# Patient Record
Sex: Female | Born: 1985 | Hispanic: Yes | Marital: Married | Smoking: Never smoker
Health system: Southern US, Community
[De-identification: ages and names within clinical notes are randomized; demographics above are authoritative.]

## PROBLEM LIST (undated history)

## (undated) DIAGNOSIS — E785 Hyperlipidemia, unspecified: Secondary | ICD-10-CM

## (undated) DIAGNOSIS — T4145XA Adverse effect of unspecified anesthetic, initial encounter: Secondary | ICD-10-CM

## (undated) DIAGNOSIS — E1165 Type 2 diabetes mellitus with hyperglycemia: Secondary | ICD-10-CM

## (undated) DIAGNOSIS — T8859XA Other complications of anesthesia, initial encounter: Secondary | ICD-10-CM

## (undated) HISTORY — DX: Adverse effect of unspecified anesthetic, initial encounter: T41.45XA

## (undated) HISTORY — DX: Other complications of anesthesia, initial encounter: T88.59XA

## (undated) HISTORY — DX: Hyperlipidemia, unspecified: E78.5

## (undated) HISTORY — DX: Type 2 diabetes mellitus with hyperglycemia: E11.65

---
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
## 2006-06-30 IMAGING — US US OB COMP +14 WK
2 series · 12 of 28 positions shown · non-contrast
Comparison: none

CLINICAL DATA: 39.4 weeks assigned gestational age with back pain and sporadic contractions  No prenatal care.

[Series 1: us ob comp +14 wk · 0.28mm/px · 9 of 54 slices shown (1 of 2)]
[im 3/54]
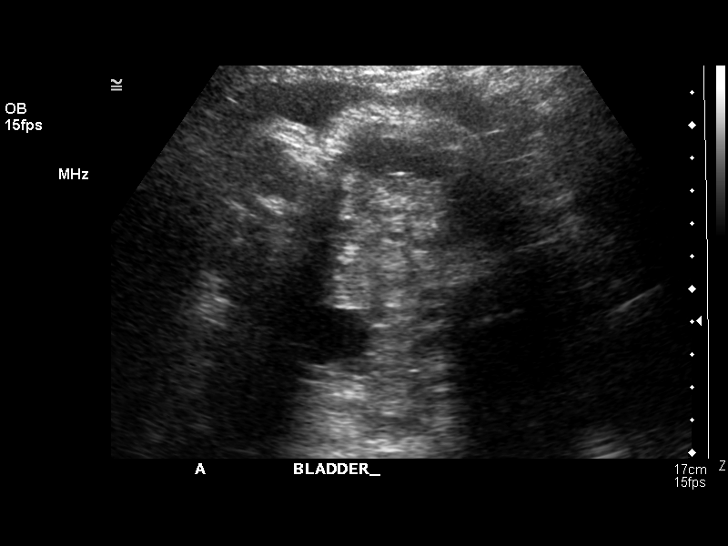
[im 9/54]
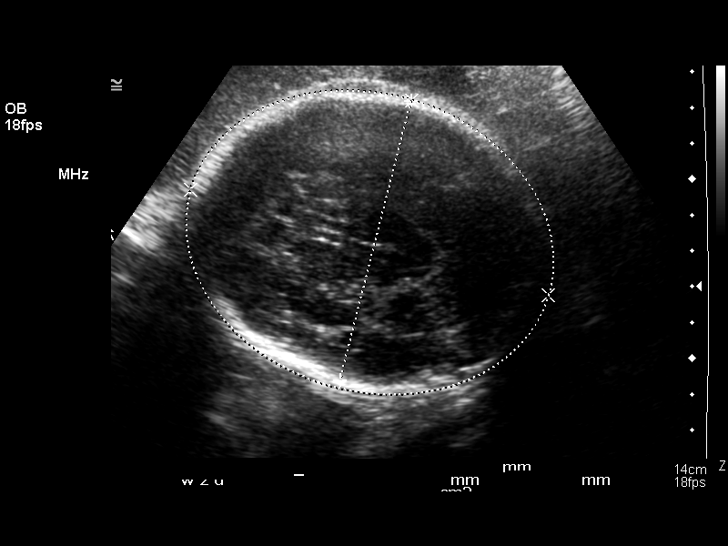
[im 14/54]
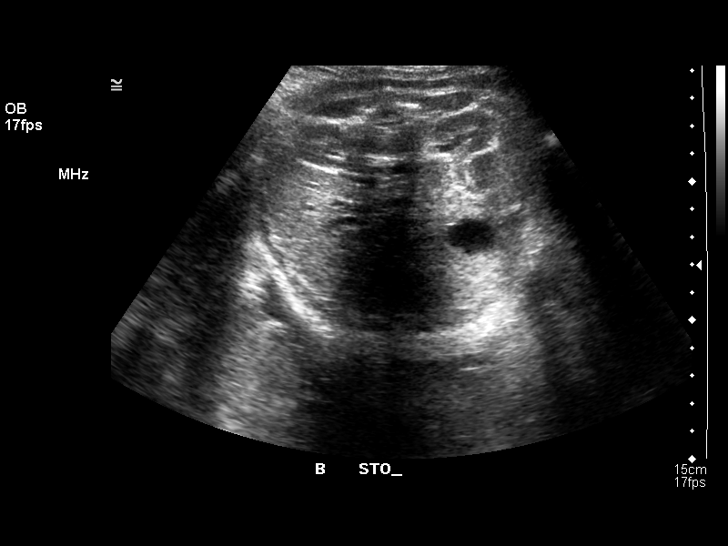
[im 23/54]
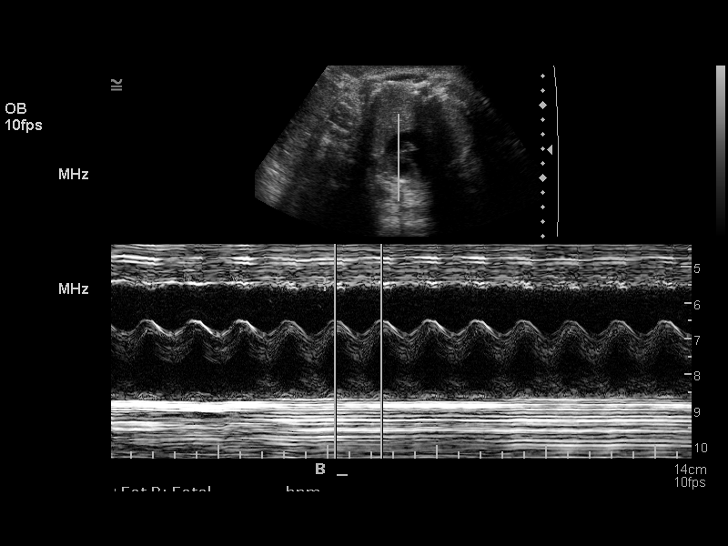
[im 28/54]
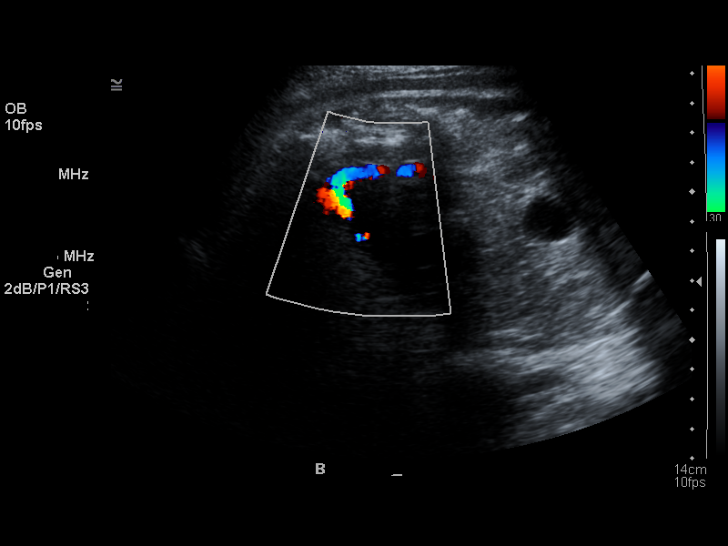
[im 34/54]
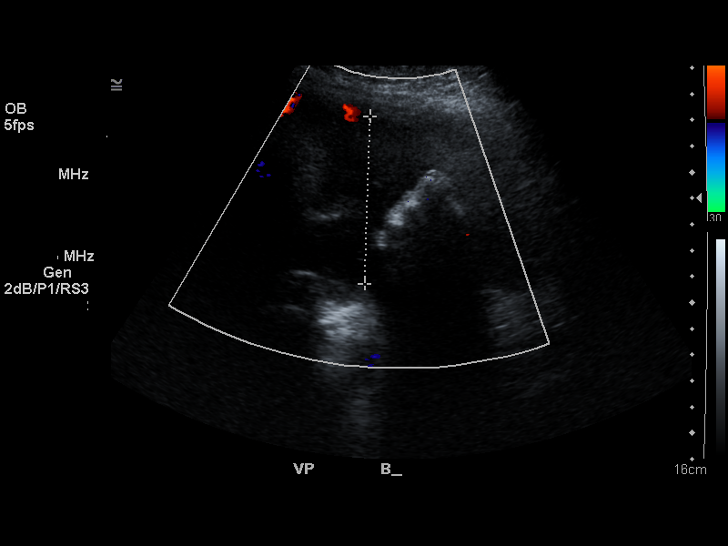
[im 42/54]
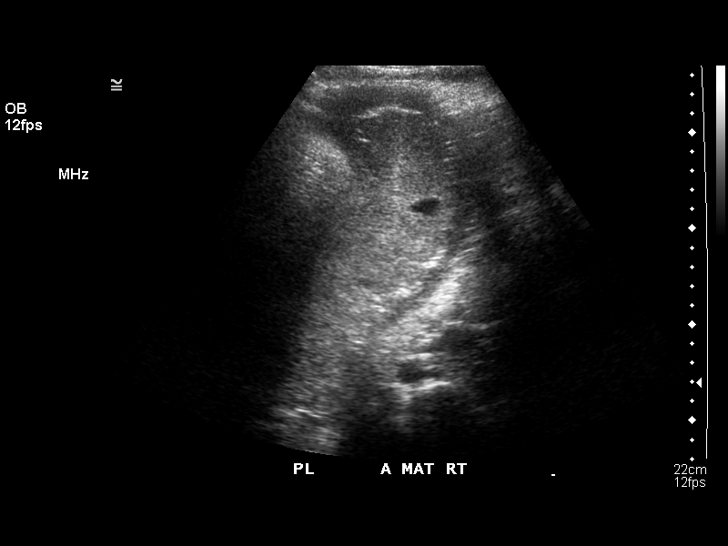
[im 48/54]
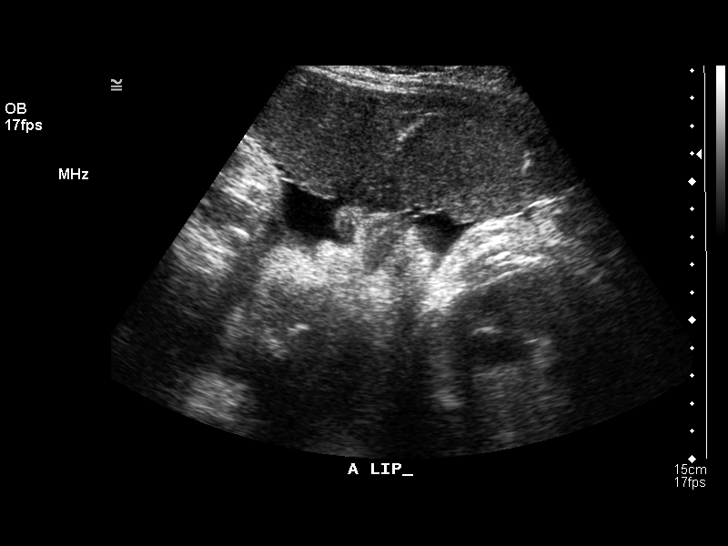
[im 54/54]
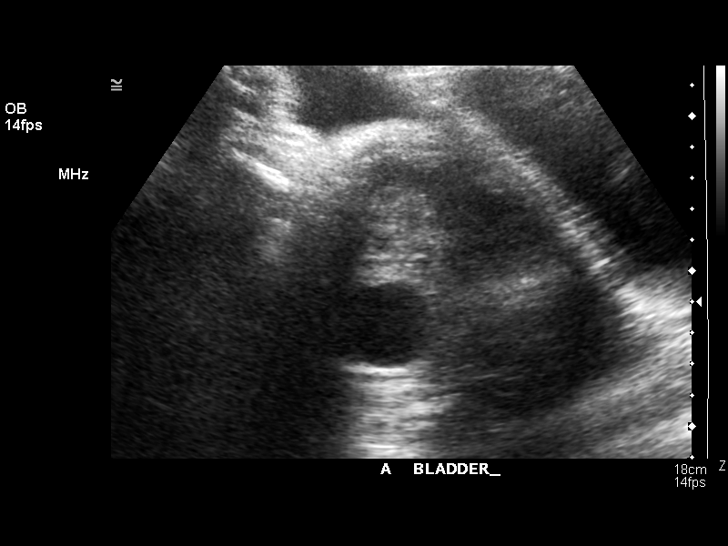

[Series 1: us ob comp +14 wk · 0.35mm/px · 3 of 23 slices shown (2 of 2)]
[im 7/23]
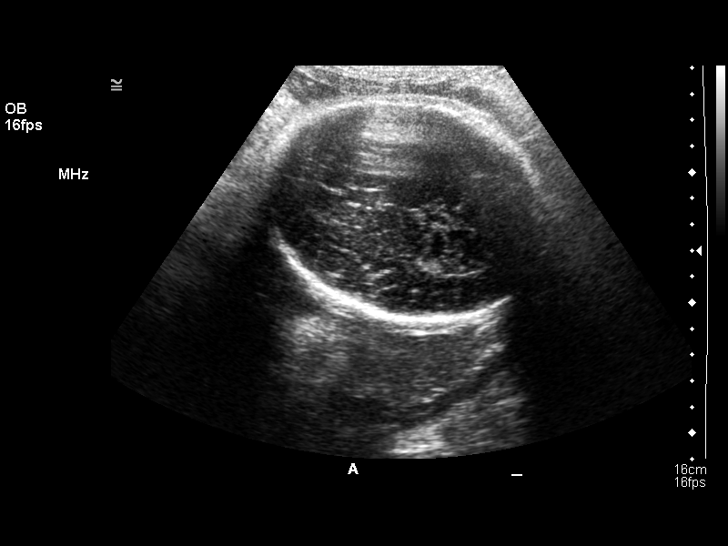
[im 13/23]
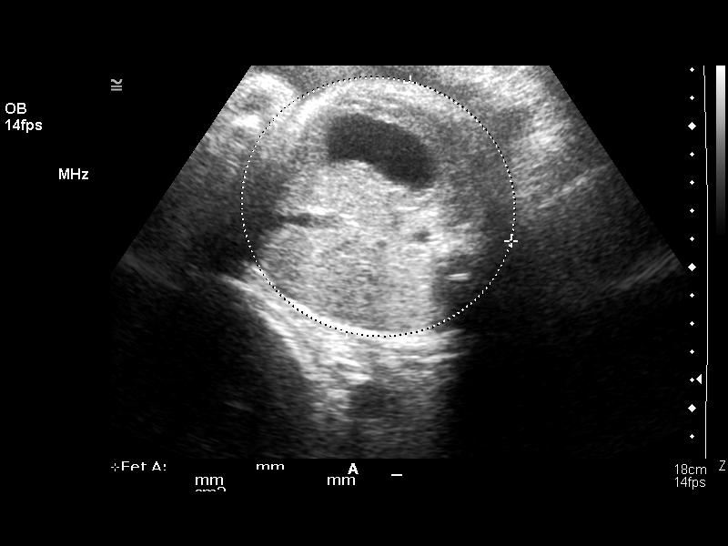
[im 19/23]
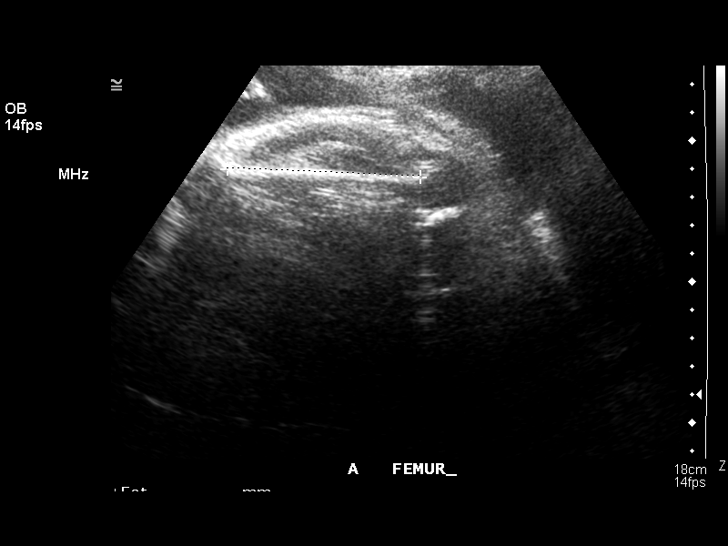

[12 of 28 positions shown; findings below may reference images not displayed]

TWIN OBSTETRICAL ULTRASOUND:
A living twin gestation is present.  A separating membrane is seen.

TWIN A:
Heart Rate:  163
Movement:  Yes
Breathing:  Yes
Presentation:  Breech
Placental Location:  Fundal, left lateral 
Grade:  II
Previa:  No
Amniotic Fluid (Subjective): Low normal
Amniotic Fluid (Objective):   2.8 cm Vertical pocket 

FETAL BIOMETRY (TWIN A)
BPD:  8.2 cm   33 w 4 d
HC:  30.5 cm  34 w 6 d
AC:  29.4 cm  33 w 3 d
FL:  7.1 cm  36 w 3 d

Mean GA:  34 w 2 d  US EDC:  11/04/05

EFW: 2422 g (H) Less than 5th%ile (5th = 1041 g) For 40 wks 

FETAL ANATOMY (TWIN A)
Lateral Ventricles:  Visualized 
Thalami/CSP:  Visualized 
Posterior Fossa:  Not visualized 
Nuchal Region:  N/A
Spine:  Not visualized 
4 Chamber Heart on L:  Visualized 
Stomach on Left:  Visualized 
3 Vessel Cord:  Visualized 
Cord Insertion Site:  Not visualized 
Kidneys:  Visualized 
Bladder:  Visualized  
Extremities:  Not visualized 

Additional Anatomy Visualized: Upper lip and orbits.
Evaluation limited by:  Fetal position and advanced gestational age.

TWIN B:
Heart Rate:  141
Movement:  Yes
Breathing:  Yes
Presentation:  Transverse with head to maternal right
Placental Location:  Anterior
Grade:  III
Previa:  No
Amniotic Fluid (Subjective):  Normal
Amniotic Fluid (Objective):   6.4 cm Vertical pocket 

FETAL BIOMETRY (TWIN B)
BPD:  9.0 cm   36 w 3 d
HC:  32.8 cm   37 w 2 d
AC:  31.7 cm   35 w 4 d
FL:  7.2 cm   36 w 6 d

Mean GA:  36 w 4 d  US EDC:  10/19/05

EFW:  3882 g (H) 5th ? 10th%ile (1041 ? 6619 g) For 40 wks

FETAL ANATOMY (TWIN B)
Lateral Ventricles:  Not visualized 
Thalami/CSP:  Not visualized 
Posterior Fossa:  Not visualized 
Nuchal Region:  N/A
Spine:  Not visualized  
4 Chamber Heart on L:  Visualized 
Stomach on Left:  Visualized 
3 Vessel Cord:  Visualized 
Cord Insertion Site:  Not visualized 
Kidneys:  Visualized 
Bladder:  Visualized 
Extremities:  Not visualized 

Evaluation limited By:  Fetal position and advanced gestational age.

MATERNAL UTERINE AND ADNEXAL FINDINGS
Cervix:  Not evaluated.  

BIOPHYSICAL PROFILE OF TWIN GESTATION:

TWIN A:
Movement:   2    Time:  30 minutes
Breathing:  2
Tone:  2
Amniotic Fluid:  2

Total Score:  8

TWIN B:
Movements: 2    Time:  30 minutes
Breathing:  2
Tone:  2
Amniotic Fluid:  2

Total Score:  8
IMPRESSION: 1.  Twin living intrauterine gestation.  A separating membrane is identified.  Fetus A is in breech presentation with subjectively low normal-to-decreased amniotic fluid volume.  The deepest measurable vertical pocket for twin A is 2.8 cm.  Estimated mean gestational age for fetus A is 34 weeks 2 days which is more than 5 weeks behind the reported assigned gestational age.  There is fairly good concordance of the fetal biometric parameters with the best estimated fetal weight falling below the 5th percentile for a 40 week gestation.  Imaging features are suspicious for IUGR.  
2.  Assessment of fetal A anatomy is limited by fetal position and the advanced gestational age, but no gross fetal anatomic anomaly is discernible.  
3.  Biophysical profile for twin A is [DATE] after 30 minutes of observation.  
4.  Twin B is in transverse presentation with subjectively normal amniotic fluid volume.  Estimated mean gestational age for twin B is 36 weeks 4 days which is approximately 3 weeks behind the reported assigned gestational age.  
5.  Best estimated fetal weight would be in the 5th to 10th percentile for a 40 week gestation and a small-for-gestational-age fetus is a concern.
6.  Visualized fetal anatomy for twin B is unremarkable although the assessment is limited by the advanced gestational age and fetal position.  
7.  Biophysical profile score for twin B is [DATE] after 30 minutes of observation.
8.  A preliminary copy of this report was returned with the patient to the [facility] at the time that the study was completed.

## 2006-11-13 ENCOUNTER — Ambulatory Visit: Payer: Self-pay | Admitting: Internal Medicine

## 2006-11-21 ENCOUNTER — Ambulatory Visit: Payer: Self-pay | Admitting: *Deleted

## 2007-01-06 ENCOUNTER — Ambulatory Visit: Payer: Self-pay | Admitting: Internal Medicine

## 2007-01-06 ENCOUNTER — Encounter: Payer: Self-pay | Admitting: Internal Medicine

## 2007-11-19 ENCOUNTER — Ambulatory Visit: Payer: Self-pay | Admitting: Nurse Practitioner

## 2007-11-20 ENCOUNTER — Encounter (INDEPENDENT_AMBULATORY_CARE_PROVIDER_SITE_OTHER): Payer: Self-pay | Admitting: Nurse Practitioner

## 2008-01-20 ENCOUNTER — Ambulatory Visit: Payer: Self-pay | Admitting: Nurse Practitioner

## 2008-01-20 LAB — CONVERTED CEMR LAB
AST: 75 units/L — ABNORMAL HIGH (ref 0–37)
Albumin: 4.1 g/dL (ref 3.5–5.2)
BUN: 9 mg/dL (ref 6–23)
Basophils Absolute: 0 10*3/uL (ref 0.0–0.1)
Basophils Relative: 1 % (ref 0–1)
CO2: 22 meq/L (ref 19–32)
Calcium: 9.2 mg/dL (ref 8.4–10.5)
Chloride: 105 meq/L (ref 96–112)
GC Probe Amp, Genital: NEGATIVE
HDL: 53 mg/dL (ref >39)
MCHC: 32.7 g/dL (ref 30.0–36.0)
MCV: 88.4 fL (ref 78.0–100.0)
Neutro Abs: 4.4 10*3/uL (ref 1.7–7.7)
Nitrite: NEGATIVE
Pap Smear: NEGATIVE
Platelets: 292 10*3/uL (ref 150–400)
Potassium: 4.4 meq/L (ref 3.5–5.3)
Protein, U semiquant: 30
RDW: 13.3 % (ref 11.5–15.5)
Specific Gravity, Urine: 1.02
Total Protein: 7.3 g/dL (ref 6.0–8.3)
Triglycerides: 244 mg/dL — ABNORMAL HIGH (ref <150)
Urobilinogen, UA: 0.2

## 2008-01-21 ENCOUNTER — Encounter (INDEPENDENT_AMBULATORY_CARE_PROVIDER_SITE_OTHER): Payer: Self-pay | Admitting: Nurse Practitioner

## 2008-01-27 ENCOUNTER — Encounter (INDEPENDENT_AMBULATORY_CARE_PROVIDER_SITE_OTHER): Payer: Self-pay | Admitting: Nurse Practitioner

## 2008-01-27 LAB — CONVERTED CEMR LAB
Hep A Total Ab: POSITIVE — AB
Hep B Core Total Ab: NEGATIVE
Hep B E Ab: NEGATIVE
Hep B S Ab: POSITIVE — AB

## 2008-03-23 ENCOUNTER — Ambulatory Visit: Payer: Self-pay | Admitting: Nurse Practitioner

## 2008-03-25 LAB — CONVERTED CEMR LAB
Cholesterol: 208 mg/dL — ABNORMAL HIGH (ref 0–200)
LDL Cholesterol: 90 mg/dL (ref 0–99)
Total CHOL/HDL Ratio: 4

## 2008-07-06 ENCOUNTER — Ambulatory Visit: Payer: Self-pay | Admitting: Nurse Practitioner

## 2008-07-06 DIAGNOSIS — E7800 Pure hypercholesterolemia, unspecified: Secondary | ICD-10-CM

## 2008-08-17 ENCOUNTER — Ambulatory Visit: Payer: Self-pay | Admitting: Nurse Practitioner

## 2008-08-17 LAB — CONVERTED CEMR LAB
ALT: 26 units/L (ref 0–35)
Albumin: 4.1 g/dL (ref 3.5–5.2)
HDL: 42 mg/dL (ref >39)
Indirect Bilirubin: 0.3 mg/dL (ref 0.0–0.9)
Total CHOL/HDL Ratio: 3.8
Total Protein: 7.3 g/dL (ref 6.0–8.3)
VLDL: 44 mg/dL — ABNORMAL HIGH (ref 0–40)

## 2008-08-18 ENCOUNTER — Encounter (INDEPENDENT_AMBULATORY_CARE_PROVIDER_SITE_OTHER): Payer: Self-pay | Admitting: Nurse Practitioner

## 2009-03-29 ENCOUNTER — Encounter (INDEPENDENT_AMBULATORY_CARE_PROVIDER_SITE_OTHER): Payer: Self-pay | Admitting: Nurse Practitioner

## 2009-03-29 ENCOUNTER — Ambulatory Visit: Payer: Self-pay | Admitting: Nurse Practitioner

## 2009-03-29 LAB — CONVERTED CEMR LAB
Glucose, Urine, Semiquant: NEGATIVE
KOH Prep: NEGATIVE
Ketones, urine, test strip: NEGATIVE
Nitrite: NEGATIVE
Protein, U semiquant: NEGATIVE
Urobilinogen, UA: 0.2

## 2009-03-30 ENCOUNTER — Encounter (INDEPENDENT_AMBULATORY_CARE_PROVIDER_SITE_OTHER): Payer: Self-pay | Admitting: Nurse Practitioner

## 2009-03-30 LAB — CONVERTED CEMR LAB
ALT: 20 units/L (ref 0–35)
AST: 17 units/L (ref 0–37)
Albumin: 4.3 g/dL (ref 3.5–5.2)
Alkaline Phosphatase: 83 units/L (ref 39–117)
BUN: 13 mg/dL (ref 6–23)
Basophils Absolute: 0 10*3/uL (ref 0.0–0.1)
Basophils Relative: 0 % (ref 0–1)
CO2: 21 meq/L (ref 19–32)
Calcium: 9.2 mg/dL (ref 8.4–10.5)
Chlamydia, DNA Probe: NEGATIVE
Chloride: 106 meq/L (ref 96–112)
Cholesterol: 156 mg/dL (ref 0–200)
Creatinine, Ser: 0.74 mg/dL (ref 0.40–1.20)
Eosinophils Absolute: 0.2 10*3/uL (ref 0.0–0.7)
Eosinophils Relative: 2 % (ref 0–5)
GC Probe Amp, Genital: NEGATIVE
Glucose, Bld: 93 mg/dL (ref 70–99)
HCT: 43.9 % (ref 36.0–46.0)
HDL: 37 mg/dL — ABNORMAL LOW (ref >39)
Hemoglobin: 14.6 g/dL (ref 12.0–15.0)
Lymphocytes Relative: 35 % (ref 12–46)
Lymphs Abs: 2.6 10*3/uL (ref 0.7–4.0)
MCHC: 33.3 g/dL (ref 30.0–36.0)
MCV: 85.6 fL (ref 78.0–100.0)
Monocytes Absolute: 0.6 10*3/uL (ref 0.1–1.0)
Monocytes Relative: 8 % (ref 3–12)
Neutro Abs: 4.1 10*3/uL (ref 1.7–7.7)
Neutrophils Relative %: 55 % (ref 43–77)
Platelets: 291 10*3/uL (ref 150–400)
Potassium: 4.2 meq/L (ref 3.5–5.3)
RBC: 5.13 M/uL — ABNORMAL HIGH (ref 3.87–5.11)
RDW: 13.5 % (ref 11.5–15.5)
Sodium: 140 meq/L (ref 135–145)
TSH: 1.042 microintl units/mL (ref 0.350–4.500)
Total Bilirubin: 0.4 mg/dL (ref 0.3–1.2)
Total CHOL/HDL Ratio: 4.2
Total Protein: 7.5 g/dL (ref 6.0–8.3)
Triglycerides: 407 mg/dL — ABNORMAL HIGH (ref <150)
WBC: 7.5 10*3/uL (ref 4.0–10.5)

## 2010-08-14 ENCOUNTER — Encounter (INDEPENDENT_AMBULATORY_CARE_PROVIDER_SITE_OTHER): Payer: Self-pay | Admitting: Nurse Practitioner

## 2010-08-14 ENCOUNTER — Ambulatory Visit: Payer: Self-pay | Admitting: Nurse Practitioner

## 2010-08-14 DIAGNOSIS — E669 Obesity, unspecified: Secondary | ICD-10-CM | POA: Insufficient documentation

## 2010-08-14 LAB — CONVERTED CEMR LAB
Bilirubin Urine: NEGATIVE
Ketones, urine, test strip: NEGATIVE
Nitrite: NEGATIVE
Protein, U semiquant: NEGATIVE
Urobilinogen, UA: 0.2
pH: 6

## 2010-08-15 ENCOUNTER — Encounter (INDEPENDENT_AMBULATORY_CARE_PROVIDER_SITE_OTHER): Payer: Self-pay | Admitting: Nurse Practitioner

## 2010-08-16 ENCOUNTER — Encounter (INDEPENDENT_AMBULATORY_CARE_PROVIDER_SITE_OTHER): Payer: Self-pay | Admitting: Nurse Practitioner

## 2010-08-16 DIAGNOSIS — O24919 Unspecified diabetes mellitus in pregnancy, unspecified trimester: Secondary | ICD-10-CM

## 2010-08-16 LAB — CONVERTED CEMR LAB
Alkaline Phosphatase: 69 units/L (ref 39–117)
BUN: 13 mg/dL (ref 6–23)
CO2: 25 meq/L (ref 19–32)
Chloride: 103 meq/L (ref 96–112)
Eosinophils Absolute: 0.2 10*3/uL (ref 0.0–0.7)
Glucose, Bld: 145 mg/dL — ABNORMAL HIGH (ref 70–99)
HCT: 44 % (ref 36.0–46.0)
Lymphs Abs: 3.3 10*3/uL (ref 0.7–4.0)
MCHC: 33.2 g/dL (ref 30.0–36.0)
MCV: 87.5 fL (ref 78.0–100.0)
RBC: 5.03 M/uL (ref 3.87–5.11)
RDW: 12.8 % (ref 11.5–15.5)
Sodium: 138 meq/L (ref 135–145)
TSH: 1.314 microintl units/mL (ref 0.350–4.500)
Total Protein: 7.3 g/dL (ref 6.0–8.3)
WBC: 9.4 10*3/uL (ref 4.0–10.5)

## 2010-08-17 ENCOUNTER — Telehealth (INDEPENDENT_AMBULATORY_CARE_PROVIDER_SITE_OTHER): Payer: Self-pay | Admitting: Nurse Practitioner

## 2010-08-17 ENCOUNTER — Ambulatory Visit: Payer: Self-pay | Admitting: Nurse Practitioner

## 2010-08-17 ENCOUNTER — Encounter (INDEPENDENT_AMBULATORY_CARE_PROVIDER_SITE_OTHER): Payer: Self-pay | Admitting: Nurse Practitioner

## 2010-08-17 LAB — CONVERTED CEMR LAB
Cholesterol: 197 mg/dL (ref 0–200)
HDL: 50 mg/dL (ref >39)
LDL Cholesterol: 115 mg/dL — ABNORMAL HIGH (ref 0–99)
Triglycerides: 161 mg/dL — ABNORMAL HIGH (ref <150)
VLDL: 32 mg/dL (ref 0–40)

## 2010-08-18 ENCOUNTER — Encounter (INDEPENDENT_AMBULATORY_CARE_PROVIDER_SITE_OTHER): Payer: Self-pay | Admitting: Nurse Practitioner

## 2010-09-19 ENCOUNTER — Ambulatory Visit: Admit: 2010-09-19 | Payer: Self-pay | Admitting: Nurse Practitioner

## 2010-09-28 NOTE — Progress Notes (Signed)
Summary: Office Visit//DEPRESSION SCREENING  Office Visit//DEPRESSION SCREENING   Imported By: Arta Bruce 08/15/2010 09:28:05  _____________________________________________________________________  External Attachment:    Type:   Image     Comment:   External Document

## 2010-09-28 NOTE — Progress Notes (Signed)
Summary: PAP results  Phone Note Outgoing Call   Summary of Call: Notify pt that PAP is normal. no cancer cells seen however she does have a yeast infection will treat with fluconazole 150mg  by mouth x 1 dose instruct pt to take antibiotics (rx given today in office) for uti THEN take the one diflucan for yeast Rx in basket - fax to pt's pharamcy; will be $4 at [facility] Initial call taken by: Lehman Prom FNP,  August 17, 2010 2:27 PM  Follow-up for Phone Call        Called [phone].... Husband answered and gave me pt's phone #... Called [phone], lm for pt. to call me .Marland Kitchen... Hale Drone CMA  August 18, 2010 10:50 AM  Pt. returned my call... notifed her of the above info.... Will be coming to p/u Rx's today before we break for the holiday.... Hale Drone CMA  August 18, 2010 11:32 AM      New/Updated Medications: FLUCONAZOLE 150 MG TABS (FLUCONAZOLE) One tablet by mouth x 1 dose Prescriptions: FLUCONAZOLE 150 MG TABS (FLUCONAZOLE) One tablet by mouth x 1 dose  #1 x 0   Entered and Authorized by:   Lehman Prom FNP   Signed by:   Lehman Prom FNP on 08/17/2010   Method used:   Print then Give to Patient   RxID:   [id number]  Phone Note Outgoing Call   Summary of Call: Notify pt that PAP is normal. no cancer cells seen however she does have a yeast infection will treat with fluconazole 150mg  by mouth x 1 dose instruct pt to take antibiotics (rx given today in office) for uti THEN take the one diflucan for yeast Rx in basket - fax to pt's pharamcy; will be [facility]4 at [facility] Initial call taken by: Lehman Prom FNP,  August 17, 2010 2:27 PM  Follow-up for Phone Call        Called [phone].... Husband answered and gave me pt's phone #... Called [phone], lm for pt. to call me .Marland Kitchen... Hale Drone CMA  August 18, 2010 10:50 AM  Pt. returned my call... notifed her of the above info.... Will be coming to p/u Rx's today before we break for the holiday.... Hale Drone  CMA  August 18, 2010 11:32 AM      New/Updated Medications: FLUCONAZOLE 150 MG TABS (FLUCONAZOLE) One tablet by mouth x 1 dose

## 2010-09-28 NOTE — Letter (Signed)
Summary: Lipid Letter  [facility]  [address] [place], [place] [address]   Phone: [phone]  Fax: [phone]    08/18/2010  [facility] [address] [place], [place]  [address]  Dear [name] [name]:  We have carefully reviewed your last lipid profile from 08/17/2010 and the results are noted below with a summary of recommendations for lipid management.    Cholesterol:       197     Goal: less than 200   HDL "good" Cholesterol:   50     Goal: greater than 40   LDL "bad" Cholesterol:   115     Goal: less than 100   Triglycerides:       161     Goal: less than 150    Your cholesterol labs are better.  You can stay off your medication but you MUST start efforts to exercise and lose weight. You should also be eating more baked foods instead of fried.  If you have any questions, please call. We appreciate being able to work with you.   Sincerely,    [facility] Lehman Prom FNP

## 2010-09-28 NOTE — Letter (Signed)
Summary: Handout Printed  Printed Handout:  - Dyspareunia, Painful Intercourse

## 2010-09-28 NOTE — Assessment & Plan Note (Signed)
Summary: Complete Physical Exam   Vital Signs:  [name] [name] profile:   25 year old female Height:      64. inches Weight:      219.1 pounds BMI:     37.74 Temp:     97.1 degrees F oral Pulse rate:   80 / minute Pulse rhythm:   regular Resp:     20 per minute BP sitting:   102 / 72  (left arm) Cuff size:   regular  Vitals Entered ByMarland Kitchen Levon Hedger (August 14, 2010 4:08 PM)  Nutrition Counseling: [name] [name]'s BMI is greater than 25 and therefore counseled on weight management options. CC: CPP, Lipid Management Is [name] [name] Diabetic? No Pain Assessment [name] [name] in pain? no      CBG Result 126  Does [name] [name] need assistance? Functional Status Self care Ambulation Normal Comments pt states she has not taken medication in 1 month.   CC:  CPP and Lipid Management.  History of Present Illness:  Pt into the office for a complete physical exam  PAP - last PAP done in 03/2009 3 children  Implanon placed 01/2008 No menses  Mammogram - never had a mammogram. no family history of breast cancer  Optho - no recent eye exam  Dental  - Pt has been to the dentist on [address]   Lipid Management History:      Positive NCEP/ATP III risk factors include HDL cholesterol less than 40.  Negative NCEP/ATP III risk factors include female age less than 49 years old, no history of early menopause without estrogen hormone replacement, non-diabetic, non-tobacco-user status, non-hypertensive, no ASHD (atherosclerotic heart disease), no prior stroke/TIA, no peripheral vascular disease, and no history of aortic aneurysm.        The [name] [name] states that she does not know about the "Therapeutic Lifestyle Change" diet.  Her compliance with the TLC diet is poor.  She expresses no side effects from her lipid-lowering medication.  Comments include: pt is not fasting today for labs.  The [name] [name] denies any symptoms to suggest myopathy or liver disease.    Habits & Providers  Alcohol-Tobacco-Diet     Alcohol  drinks/day: 0     Tobacco Status: never  Exercise-Depression-Behavior     Does [name] [name] Exercise: no     Exercise Counseling: to improve exercise regimen     Depression Counseling: not indicated; screening negative for depression     STD Risk: never     Drug Use: no     Seat Belt Use: 100     Sun Exposure: occasionally  Comments: PHQ-9 score = 6  Allergies (verified): No Known Drug Allergies  Review of Systems General:  Denies loss of appetite. Eyes:  Denies blurring. ENT:  Denies earache. CV:  Denies fatigue. Resp:  Denies cough. GI:  Denies abdominal pain, nausea, and vomiting. GU:  Denies discharge; postcoital pain. MS:  Denies joint pain. Derm:  Denies dryness. Neuro:  Denies headaches. Psych:  Denies depression. Endo:  Denies excessive urination.  Physical Exam  General:  alert.   Head:  normocephalic.   Eyes:  pupils equal and pupils round.   Ears:  bil TM with bony landmarks present Nose:  no nasal discharge.   Mouth:  poor dentition.   Neck:  supple.   Chest Wall:  no mass.   Breasts:  no abnormal thickening.   Lungs:  normal breath sounds.   Heart:  normal rate and regular rhythm.   Abdomen:  normal bowel sounds.   Rectal:  defer Msk:  normal ROM.   Extremities:  no edema Neurologic:  alert & oriented X3.   Skin:  color normal.   Psych:  Oriented X3.    Pelvic Exam  Vulva:      normal appearance.   Urethra and Bladder:      Urethra--normal.   Vagina:      physiologic discharge.   Cervix:      everted transformation zone, friable.   Uterus:      smooth.   Adnexa:      nontender bilaterally.      Impression & Recommendations:  Problem # 1:  ROUTINE GYNECOLOGICAL EXAMINATION (ICD-V72.31) PHQ-9 score = 6 rec optho and dental exam PAP done Orders: UA Dipstick w/o Micro (manual) (14782) KOH/ WET Mount [phone]) Pap Smear, Thin Prep ( Collection of) [phone]) T- GC Chlamydia (78469) T-CBC w/Diff (62952-84132) T-Comprehensive Metabolic  Panel (44010-27253)  Problem # 2:  HYPERCHOLESTEROLEMIA (ICD-272.0) pt needs cholesterol  Her updated medication list for this problem includes:    Pravachol 20 Mg Tabs (Pravastatin sodium) .Marland Kitchen... 1 tablet by mouth nighly for cholesterol  Problem # 3:  OBESITY (ICD-278.00) advised pt that she will need to start exercise Orders: T-TSH (66440-34742) Rapid HIV  (59563)  Problem # 4:  NEED PROPHYLACTIC VACCINATION&INOCULATION FLU (ICD-V04.81) given today in office  Problem # 5:  GLYCOSURIA (ICD-791.5) cbg done today = 126 will hgba1c to blood in lab Orders: Capillary Blood Glucose/CBG (82948) T- Hemoglobin A1C (87564-33295)  Complete Medication List: 1)  Pravachol 20 Mg Tabs (Pravastatin sodium) .Marland Kitchen.. 1 tablet by mouth nighly for cholesterol  Other Orders: T-Culture, Urine (18841-66063)  Lipid Assessment/Plan:      Based on NCEP/ATP III, the [name] [name]'s risk factor category is "0-1 risk factors".  The [name] [name]'s lipid goals are as follows: Total cholesterol goal is 200; LDL cholesterol goal is 160; HDL cholesterol goal is 40; Triglyceride goal is 150.     [name] [name] Instructions: 1)  Schedule a lab visit for fasting labs - lipids. 2)  Need results from labs done today. 3)  Your cholesterol is very, very important.  It was high last year and this is important because when you get older you want to protect your heart and arteries. 4)  You have been given the flu vaccine today. 5)  Pain with intercourse - read handout 6)  I would suggest using some KY jelly to be sure you are lubicated well. 7)  your urine will be checked for culture and vaginal samples will be checked for infection.   Orders Added: 1)  Est. [name] [name] age 47-39 [76] 2)  UA Dipstick w/o Micro (manual) [81002] 3)  KOH/ WET Mount [87210] 4)  Pap Smear, Thin Prep ( Collection of) [Q0091] 5)  T- GC Chlamydia [01601] 6)  T-TSH [09323-55732] 7)  Rapid HIV  [92370] 8)  T-CBC w/Diff [20254-27062] 9)  T-Comprehensive  Metabolic Panel [80053-22900] 10)  T-Culture, Urine [37628-31517] 11)  Capillary Blood Glucose/CBG [82948] 12)  T- Hemoglobin A1C [83036-23375]    Laboratory Results   Urine Tests  Date/Time Received: August 14, 2010 5:06 PM   Routine Urinalysis   Color: lt. yellow Glucose: 100   (Normal Range: Negative) Bilirubin: negative   (Normal Range: Negative) Ketone: negative   (Normal Range: Negative) Spec. Gravity: 1.010   (Normal Range: 1.003-1.035) Blood: small   (Normal Range: Negative) pH: 6.0   (Normal Range: 5.0-8.0) Protein: negative   (Normal Range: Negative) Urobilinogen: 0.2   (Normal Range: 0-1) Nitrite: negative   (  Normal Range: Negative) Leukocyte Esterace: trace   (Normal Range: Negative)     Blood Tests   Date/Time Received: August 14, 2010 5:28 PM   CBG Random:: 126mg /dL

## 2010-10-03 ENCOUNTER — Encounter (INDEPENDENT_AMBULATORY_CARE_PROVIDER_SITE_OTHER): Payer: Self-pay | Admitting: Nurse Practitioner

## 2010-10-18 NOTE — Letter (Signed)
Summary: NUTRITION SUMMARY//SUSIE  NUTRITION SUMMARY//SUSIE   Imported By: Arta Bruce 10/11/2010 13:49:51  _____________________________________________________________________  External Attachment:    Type:   Image     Comment:   External Document

## 2011-01-12 NOTE — Discharge Summary (Signed)
[name], [name]               ACCOUNT NO.:  [id number]   MEDICAL RECORD NO.:  [id number]          PATIENT TYPE:  INP   LOCATION:  9105                          FACILITY:  [facility]   PHYSICIAN:  Angie B. Merlene Morse, MD  DATE OF BIRTH:  1986/08/10   DATE OF ADMISSION:  09/27/2005  DATE OF DISCHARGE:  09/30/2005                                 DISCHARGE SUMMARY   REASON FOR ADMISSION:  Term intrauterine pregnancy with twin gestation, twin  A in breech presentation, twin B with transverse presentation, presented for  scheduled cesarean section.   DISCHARGE DIAGNOSIS:  Status post cesarean section.   PROCEDURE:  Cesarean section done by Dr. Penne Lash.   HISTORY OF PRESENT ILLNESS:  The patient is an 25 year old G1, P0 with twin  gestation at 57 weeks confirmed by a 17-week ultrasound, here for scheduled  C-section. Pregnancy complicated by multiple gestation. Baby A is breech,  baby B is transverse.   PHYSICAL EXAMINATION:  Unremarkable.   HOSPITAL COURSE:  The patient underwent a C-section on February 1, 200,  please see operative note for more information. The patient remained stable  after her C-section, remaining afebrile and vital signs stable. Her pain was  controlled as well as her bleeding. The patient was discharged to home.   DISCHARGE INSTRUCTIONS:  She is to follow up in 6 weeks with [facility].  She is to lift nothing heavier than the baby for 6 weeks. Diet regular.   MEDICATIONS:  Ibuprofen 600 #30, Colace 100 #30, iron 325 b.i.d. #60,  Percocet #20.           ______________________________  August Saucer. Merlene Morse, MD     ABC/MEDQ  D:  09/30/2005  T:  09/30/2005  Job:  [id number]

## 2011-01-12 NOTE — Op Note (Signed)
[name] [name], [name] [name]               ACCOUNT NO.:  [id number]   MEDICAL RECORD NO.:  [id number]          PATIENT TYPE:  INP   LOCATION:  9199                          [facility]ILITY:  [facility]   PHYSICIAN:  Lesly Dukes, M.D. DATE OF BIRTH:  [birth year]   DATE OF PROCEDURE:  09/27/2005  DATE OF DISCHARGE:                                 OPERATIVE REPORT   PREOPERATIVE DIAGNOSES:  1.  Thirty-seven week two-day intrauterine pregnancy,  2.  Twin gestation with baby A being breech and baby B being transverse.  3.  Minimal prenatal care.   POSTOPERATIVE DIAGNOSS:  1.  Thirty-seven week two-day intrauterine pregnancy,  2.  Twin gestation with baby A being breech and baby B being transverse.  3.  Minimal prenatal care.   PROCEDURE:  Primary low transverse cesarean section via Pfannenstiel.   SURGEON:  Lesly Dukes, M.D.   ASSISTANTS:  Carolanne Grumbling, M.D., and Angie B. Merlene Morse, M.D.   ANESTHESIA:  Spinal.   COMPLICATIONS:  None.   ESTIMATED BLOOD LOSS:  600 mL.   FLUIDS:  2600 mL lactated Ringer's.   URINE OUTPUT:  200 mL of clear urine.   INDICATIONS:  The patient is an 25 year old G1, P0, at 67 and two weeks'  gestational age dated by a 17-week ultrasound done in [place], who presented  for a scheduled primary low transverse cesarean section. The patient has a  twin gestation with baby A being breech of baby B being transverse back-up.  The patient was counseled on risks and benefits of surgery and wanted to  proceed.   FINDINGS:  Baby A was cephalic in the breech presentation, with Apgars 8/9,  weight of 5 pounds 7 ounces.  Delivery time 9:48.  Cord pH 7.22.  Baby B was  a female in the transverse back-up presentation, Apgars 9/9, weight 5 pounds.  15 ounces.  Delivery time 9:51.  Cord pH 7.30.  Normal uterus, tubes and  ovaries.   PROCEDURE:  The patient was taken to the operating room, where spinal  anesthesia was placed and found be adequate.  She was then prepped and  draped in normal sterile fashion in the dorsal supine position with a  leftward tilt.  A Pfannenstiel skin incision was then made with a scalpel  and carried through to the underlying layer of fascia.  The fascia was  nicked in the midline and the incision extended laterally with Mayo  scissors.  The superior aspect of the fascial incision was then grasped with  a Kocher clamp, elevated and the underlying rectus muscles dissected off  bluntly.  Attention was then turned to inferior aspect of the incision,  which in similar fashion was grasped, tented up with Kocher clamps, and the  rectus muscles dissected off bluntly.  The rectus muscles were then  separated in the midline and the peritoneum identified, tented up and  entered sharply with the Metzenbaum scissors.  The peritoneal incision was  extended superiorly and inferiorly with good visualization of the bladder.  A bladder blade was then inserted and the vesicouterine peritoneum  identified, grasped with pickups  and entered sharply with the Metzenbaum  scissors.  The incision was then extended laterally and a bladder flap  created digitally.  The bladder blade was then reinserted and the lower  uterine segment incised in a transverse fashion with a scalpel.  The uterine  incision was then extended laterally with bandage scissors.  The bladder  blade was removed.  Baby A's amniotic sac was ruptured, then the baby was  able to be delivered, but first, but was actually delivered, the left leg  delivered first, then the right leg, followed by the buttocks and ultimately  chest and head.  All this was done atraumatically.  The cord was clamped and  cut and the baby was handed off to the waiting neonatologist.  Attention was  then turned to baby B.  Presentation was confirmed that it was transverse  with the back up.  The amniotic sac was ruptured and the head was delivered  using vacuum assistance atraumatically and then the rest of body.   The mouth  was bulb-suctioned and cord clamped and cut.  The infant was handed off to  the waiting neonatologist.  Cord gases were sent for both babies.   The placenta was then removed manually.  The uterus exteriorized and cleared  of all clot and debris.  The uterine incision was repaired with 0 Vicryl in  a running locked fashion.  A 2-0 chromic on an SH needle was used for some  bleeders, but ultimately excellent hemostasis was obtained without  difficulty.  The uterus was replaced and the gutters irrigated.  The  superior aspect of the fascia was inspected and there was no bleeding, and  the inferior aspect was also inspected and no there was no bleeding, so then  the fascia was reapproximated with 0 Vicryl in a running fashion. The skin  was closed with staples.   The patient tolerated procedure well.  Sponge, lap and needle counts were  correct x2.  Antibiotics were given at cord clamp.     ______________________________  Marc Morgans. Mayford Knife, M.D.    ______________________________  Lesly Dukes, M.D.    TLW/MEDQ  D:  09/27/2005  T:  09/27/2005  Job:  [id number]

## 2011-05-15 ENCOUNTER — Other Ambulatory Visit (HOSPITAL_COMMUNITY): Payer: Self-pay | Admitting: Family Medicine

## 2011-05-15 ENCOUNTER — Ambulatory Visit (HOSPITAL_COMMUNITY)
Admission: RE | Admit: 2011-05-15 | Discharge: 2011-05-15 | Disposition: A | Discharge status: Home / Self Care | Payer: Self-pay | Source: Ambulatory Visit | Attending: Family Medicine | Admitting: Family Medicine

## 2011-05-15 DIAGNOSIS — R52 Pain, unspecified: Secondary | ICD-10-CM

## 2011-05-15 DIAGNOSIS — R109 Unspecified abdominal pain: Secondary | ICD-10-CM | POA: Insufficient documentation

## 2012-02-17 IMAGING — CR DG ABDOMEN 1V
1 series · 1 of 1 positions shown · non-contrast
Comparison: None.

CLINICAL DATA: 25-year-old female with abdominal pain.

ABDOMEN - 1 VIEW

[t abdomen supine]
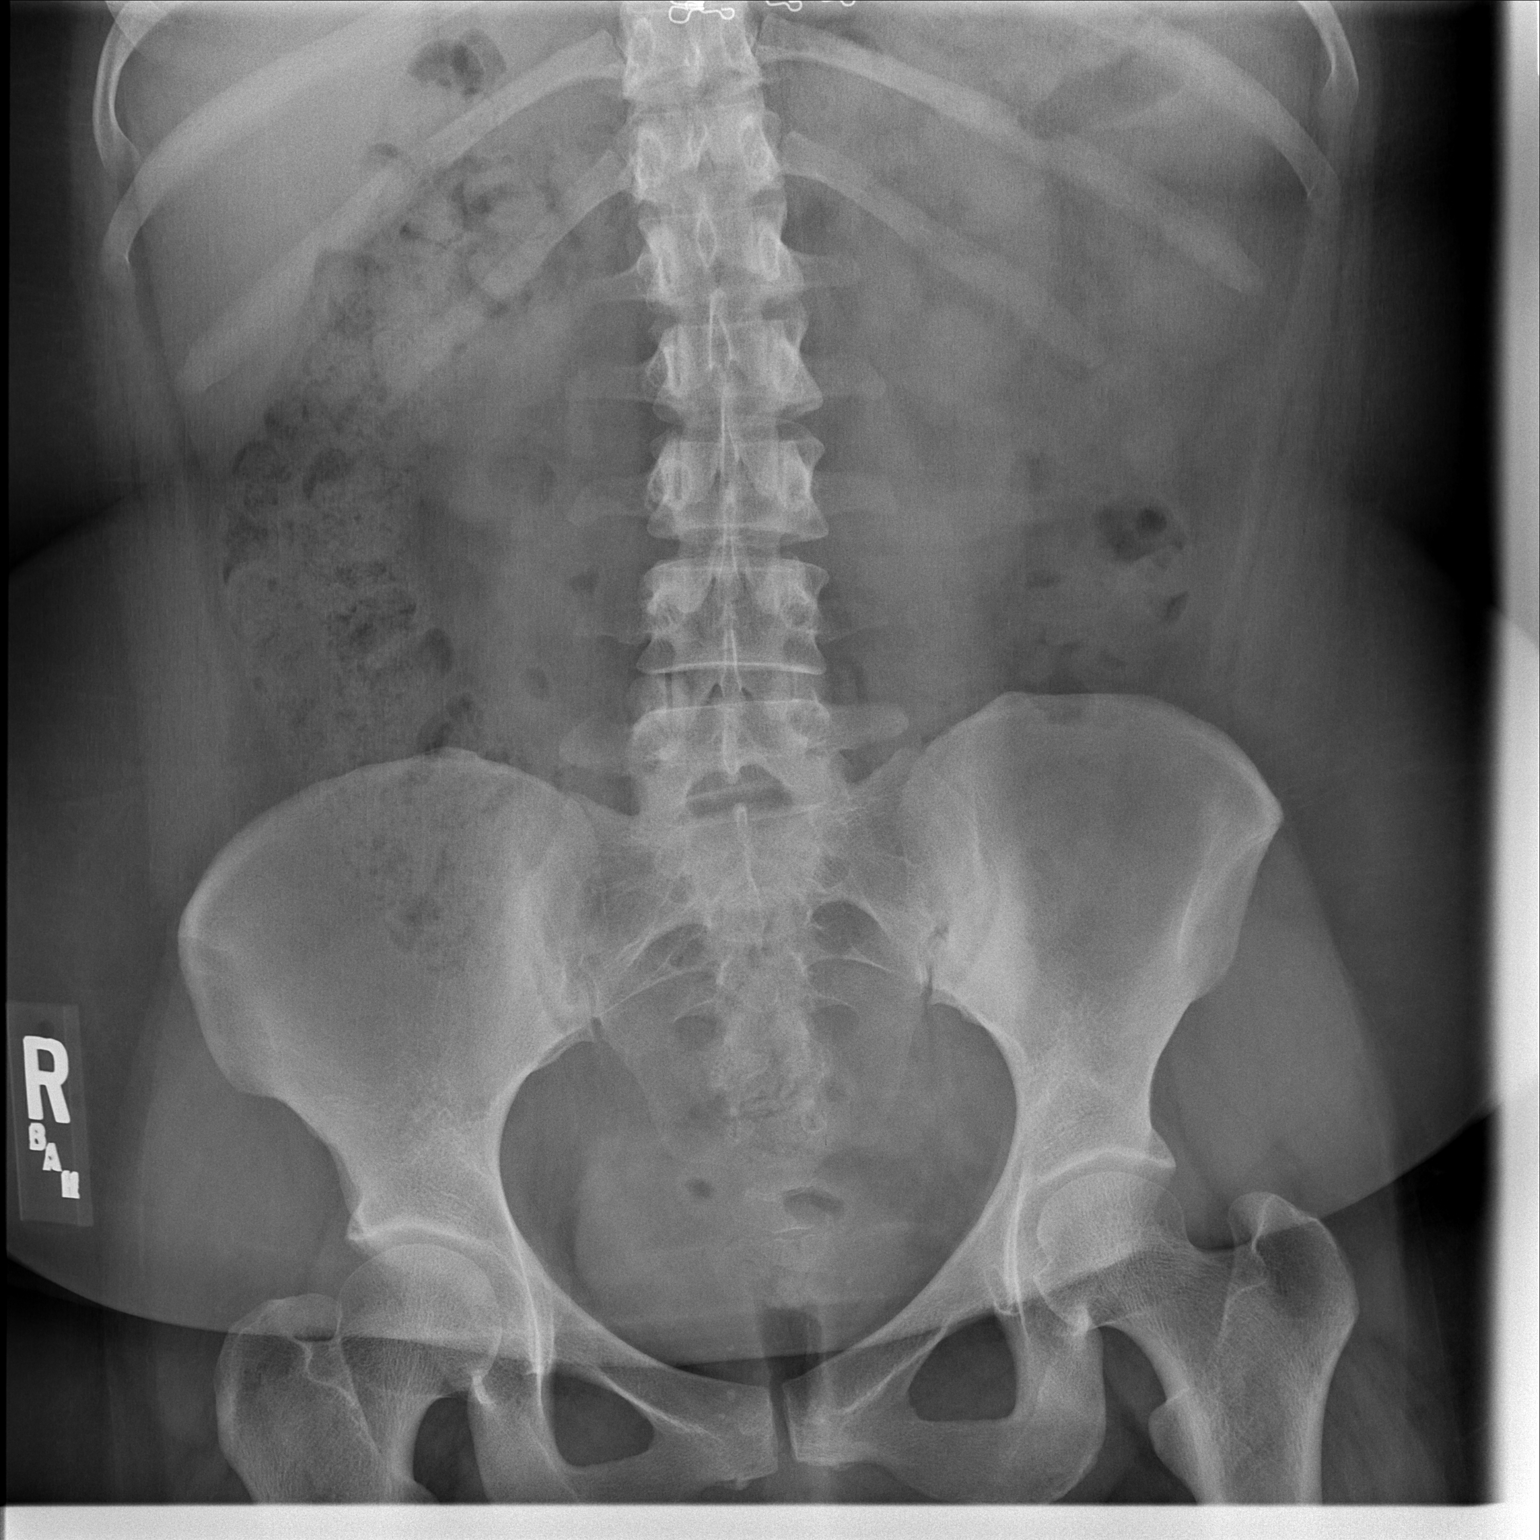

[1 of 1 positions shown; findings below may reference images not displayed]

FINDINGS: Nonobstructed bowel gas pattern.  Mild to moderate volume
of retained stool the colon, mostly in the right colon and proximal
transverse.  Mild scoliosis.  Abdominal and pelvic visceral
contours are within normal limits. No acute osseous abnormality
identified.
IMPRESSION: Nonobstructed bowel gas pattern.

## 2012-02-25 ENCOUNTER — Other Ambulatory Visit: Payer: Self-pay

## 2012-02-25 DIAGNOSIS — Z331 Pregnant state, incidental: Secondary | ICD-10-CM

## 2012-02-25 NOTE — Progress Notes (Signed)
PRENATAL LABS DONE TODAY [name] [name] 

## 2012-02-26 LAB — OBSTETRIC PANEL
Antibody Screen: NEGATIVE
Basophils Absolute: 0 10*3/uL (ref 0.0–0.1)
Eosinophils Absolute: 0.2 10*3/uL (ref 0.0–0.7)
HCT: 38.4 % (ref 36.0–46.0)
Lymphs Abs: 2.5 10*3/uL (ref 0.7–4.0)
MCHC: 33.9 g/dL (ref 30.0–36.0)
Neutro Abs: 6.1 10*3/uL (ref 1.7–7.7)
Platelets: 264 10*3/uL (ref 150–400)
RDW: 14 % (ref 11.5–15.5)
Rh Type: POSITIVE
Rubella: 32.5 IU/mL — ABNORMAL HIGH

## 2012-02-26 LAB — SICKLE CELL SCREEN: Sickle Cell Screen: NEGATIVE

## 2012-02-26 LAB — HIV ANTIBODY (ROUTINE TESTING W REFLEX): HIV: NONREACTIVE

## 2012-02-27 LAB — CULTURE, OB URINE: Colony Count: NO GROWTH

## 2012-03-03 ENCOUNTER — Ambulatory Visit (INDEPENDENT_AMBULATORY_CARE_PROVIDER_SITE_OTHER): Payer: Self-pay | Admitting: Family Medicine

## 2012-03-03 ENCOUNTER — Encounter: Payer: Self-pay | Admitting: Family Medicine

## 2012-03-03 ENCOUNTER — Other Ambulatory Visit (HOSPITAL_COMMUNITY)
Admission: RE | Admit: 2012-03-03 | Discharge: 2012-03-03 | Disposition: A | Discharge status: Home / Self Care | Payer: Self-pay | Source: Ambulatory Visit | Attending: Family Medicine | Admitting: Family Medicine

## 2012-03-03 VITALS — BP 120/78 | Wt 210.6 lb

## 2012-03-03 DIAGNOSIS — Z01419 Encounter for gynecological examination (general) (routine) without abnormal findings: Secondary | ICD-10-CM | POA: Insufficient documentation

## 2012-03-03 DIAGNOSIS — Z113 Encounter for screening for infections with a predominantly sexual mode of transmission: Secondary | ICD-10-CM | POA: Insufficient documentation

## 2012-03-03 DIAGNOSIS — Z3480 Encounter for supervision of other normal pregnancy, unspecified trimester: Secondary | ICD-10-CM

## 2012-03-03 DIAGNOSIS — O99210 Obesity complicating pregnancy, unspecified trimester: Secondary | ICD-10-CM

## 2012-03-03 DIAGNOSIS — Z3491 Encounter for supervision of normal pregnancy, unspecified, first trimester: Secondary | ICD-10-CM

## 2012-03-03 DIAGNOSIS — O99211 Obesity complicating pregnancy, first trimester: Secondary | ICD-10-CM

## 2012-03-03 LAB — GLUCOSE, CAPILLARY
Comment 1: 1
Glucose-Capillary: 172 mg/dL — ABNORMAL HIGH (ref 70–99)

## 2012-03-06 ENCOUNTER — Other Ambulatory Visit: Payer: Self-pay

## 2012-03-06 DIAGNOSIS — Z3480 Encounter for supervision of other normal pregnancy, unspecified trimester: Secondary | ICD-10-CM

## 2012-03-06 NOTE — Progress Notes (Signed)
3 HR GTT DONE TODAY Johnita Palleschi 

## 2012-03-07 ENCOUNTER — Encounter: Payer: Self-pay | Admitting: Family Medicine

## 2012-03-07 LAB — GLUCOSE TOLERANCE, 3 HOURS
Glucose Tolerance, 1 hour: 180 mg/dL (ref 70–189)
Glucose Tolerance, 2 hour: 166 mg/dL — ABNORMAL HIGH (ref 70–164)
Glucose, GTT - 3 Hour: 146 mg/dL — ABNORMAL HIGH (ref 70–144)

## 2012-03-07 NOTE — Progress Notes (Signed)
26 y.o. F [id number] @ 11w 3d based on LMP presenting for her initial prenatal visit. She complains of right lower quadrant pain that has been persistent for several months prior to her pregnancy. It is not accompanied by nausea, vomiting, diarrhea, or constipation. Also, the patient would like to have a tubal ligation after this delivery.  BP 120/78  Wt 210 lb 9.6 oz (95.528 kg)  LMP 12/14/2011 Abd: obese, gravid, non-tender, no rebound or guarding A/P: 26 yo @ 11w 3d with history of multiparity and c-section who is obese. She will get a 1 hour glucose tolerance test today. There is no evidence of acute peritoneal signs warranting further GI evaluation at this time.

## 2012-03-11 ENCOUNTER — Telehealth: Payer: Self-pay | Admitting: Family Medicine

## 2012-03-11 ENCOUNTER — Other Ambulatory Visit: Payer: Self-pay | Admitting: Family Medicine

## 2012-03-11 DIAGNOSIS — O99810 Abnormal glucose complicating pregnancy: Secondary | ICD-10-CM

## 2012-03-11 NOTE — Telephone Encounter (Signed)
I called the patient to inform her of her elevated blood sugar. She was at work, so I spoke with her husband and informed him of the results and that we will be making a referral to the OB-GYN clinic at [facility]. He understood, and stated that he would inform the patient.

## 2012-03-24 ENCOUNTER — Encounter: Payer: Self-pay | Admitting: Obstetrics & Gynecology

## 2012-03-24 ENCOUNTER — Encounter: Payer: Self-pay | Attending: Obstetrics & Gynecology | Admitting: Dietician

## 2012-03-24 ENCOUNTER — Ambulatory Visit (INDEPENDENT_AMBULATORY_CARE_PROVIDER_SITE_OTHER): Payer: Self-pay | Admitting: Obstetrics & Gynecology

## 2012-03-24 VITALS — BP 122/76 | Temp 98.5°F | Wt 205.7 lb

## 2012-03-24 DIAGNOSIS — Z713 Dietary counseling and surveillance: Secondary | ICD-10-CM | POA: Insufficient documentation

## 2012-03-24 DIAGNOSIS — O99810 Abnormal glucose complicating pregnancy: Secondary | ICD-10-CM | POA: Insufficient documentation

## 2012-03-24 DIAGNOSIS — Z98891 History of uterine scar from previous surgery: Secondary | ICD-10-CM | POA: Insufficient documentation

## 2012-03-24 DIAGNOSIS — R7309 Other abnormal glucose: Secondary | ICD-10-CM

## 2012-03-24 DIAGNOSIS — O24919 Unspecified diabetes mellitus in pregnancy, unspecified trimester: Secondary | ICD-10-CM

## 2012-03-24 DIAGNOSIS — O34219 Maternal care for unspecified type scar from previous cesarean delivery: Secondary | ICD-10-CM

## 2012-03-24 LAB — POCT URINALYSIS DIP (DEVICE)
Glucose, UA: NEGATIVE mg/dL
Ketones, ur: NEGATIVE mg/dL
Nitrite: NEGATIVE
Specific Gravity, Urine: 1.015 (ref 1.005–1.030)
Urobilinogen, UA: 0.2 mg/dL (ref 0.0–1.0)

## 2012-03-24 NOTE — Progress Notes (Signed)
Pulse- 65  Pain- right side

## 2012-03-24 NOTE — Patient Instructions (Signed)
Diabetes mellitus gestacional (Gestational Diabetes Mellitus) La diabetes mellitus gestacional se produce slo durante el embarazo. Aparece cuando el organismo no puede controlar adecuadamente la glucosa (azcar) que aumenta en la sangre despus de comer. Durante el embarazo, se produce una resistencia a la insulina (sensibilidad reducida a la insulina) debido a la liberacin de hormonas por parte de la placenta. Generalmente, el pncreas de una mujer embarazada produce la cantidad suficiente de insulina para vencer esa resistencia. Sin embargo, en la diabetes gestacional, hay insulina pero no cumple su funcin adecuadamente. Si la resistencia es lo suficientemente grave como para que el pncreas no produzca la cantidad de insulina suficiente, la glucosa extra se acumula en la sangre.  QUINES TIENEN RIESGO DE DESARROLLAR DIABETES GESTACIONAL?  Las mujeres con historia de diabetes en la familia.   Las mujeres de ms de 25 aos.   Las que presentan sobrepeso.   Las mujeres que pertenecen a ciertos grupos tnicos (latinas, afroamericanas, norteamericanas nativas, asiticas y las originarias de las islas del Pacfico.  QUE PUEDE OCURRIRLE AL BEB? Si el nivel de glucosa en sangre de la madre es demasiado elevado mientras este embarazada, el nivel extra de azcar pasar por el cordn umbilical hacia el beb. Algunos de los problemas del beb pueden ser:  Beb demasiado grande: si el nio recibe demasiada azcar, puede aumentar mucho de peso. Esto puede hacer que sea demasiado grande para nacer por parto normal (vaginal) por lo que ser necesario realizar una cesrea.   Bajo nivel de glucosa (hipoglucemia): el beb produce insulina extra en respuesta a la excesiva cantidad de azcar que obtiene de la madre. Cuando el beb nace y ya no necesita insulina extra, su nivel de azcar en sangre puede disminuir.   Ictericia (coloracin amarillenta de la piel y los ojos): esto es bastante frecuente en los  bebs. La causa es la acumulacin de una sustancia qumica denominada bilirrubina. No siempre es un trastorno grave, pero se observa con frecuencia en los bebs cuyas madres sufren diabetes gestacional.  RIESGOS PARA LA MADRE Las mujeres que han sufrido diabetes gestacional pueden tener ms riesgos para algunos problemas como:  Preeclampsia o toxemia, incluyendo problemas con hipertensin arterial. La presin arterial y los niveles de protenas en la orina deben controlarse con frecuencia.   Infecciones   Parto por cesrea.   Aparicin de diabetes tipo 2 en una etapa posterior de la vida. Alrededor del 30% al 50% sufrir diabetes posteriormente, especialmente las que son obesas.  DIAGNSTICO Las hormonas que causan resistencia a la insulina tienen su mayor nivel alrededor de las 24 a 28 semanas del embarazo. Si se experimentan sntomas, stos son similares a los sntomas que normalmente aparecen durante el embarazo.  La diabetes mellitus gestacional generalmente se diagnostica por medio de un mtodo en dos partes: 1. Despus de la 24 a 28 semanas de embarazo, la mujer debe beber una solucin que contiene glucosa y realizar un anlisis de sangre. Si el nivel de glucosa es elevado, la realizarn un segundo anlisis.  2. La prueba oral de tolerancia a la glucosa, que dura aproximadamente tres horas. Despus de realizar ayuno durante la noche, se controla nivel de glucosa en sangre. La mujer bebe una solucin que contiene glucosa y le realizan anlisis de glucosa en sangre cada hora.  Si la mujer tiene factores de riesgos para la diabetes mellitus gestacional, el mdico podr indicar el anlisis antes de las 24 semanas de embarazo. TRATAMIENTO El tratamiento est dirigido a mantener la glucosa en   sangre de la madre en un nivel normal y puede incluir:  La planificacin de los alimentos.   Recibir insulina u otro medicamento para controlar el nivel de glucosa en sangre.   La prctica de ejercicios.    Llevar un registro diario de los alimentos que consume.   Control y registro de los niveles de glucosa en sangre.   Control de los niveles de cetona en la orina, aunque esto ya no se considera necesario en la mayora de los embarazos.  INSTRUCCIONES PARA EL CUIDADO DOMICILIARIO Mientras est embarazada:  Siga los consejos de su mdico relacionados con los controles prenatales, la planificacin de la comida, la actividad fsica, los medicamentos, vitaminas, los anlisis de sangre y otras pruebas y las actividades fsicas.   Lleve un registro de las comidas, las pruebas de glucosa en sangre y la cantidad de insulina que recibe (si corresponde). Muestre todo al profesional en cada consulta mdica prenatal.   Si sufre diabetes mellitus gestacional, podr tener problemas de hipoglucemia (nivel bajo de glucosa en sangre). Podr sospechar este problema si se siente repentinamente mareada, tiene temblores y/o se siente dbil. Si cree que esto le est ocurriendo, y tiene un medidor de glucosa, mida su nivel de glucosa en sangre. Siga los consejos de su mdico sobre el modo y el momento de tratar su nivel de glucosa en sangre. Generalmente se sigue la regla 15:15 Consuma 15 g de hidratos de carbono, espere 15 minutos y vuelva controlar el nivel de glucosa en sangre.. Ejemplos de 15 g de hidratos de carbono son:   1 taza de leche descremada.    taza de jugo.   3-4 tabletas de glucosa.   5-6 caramelos duros.   1 caja pequea de pasas de uva.    taza de gaseosa comn.   Mantenga una buena higiene para evitar infecciones.   No fume.  SOLICITE ATENCIN MDICA SI:  Observa prdida vaginal con o sin picazn.   Se siente ms dbil o cansada que lo habitual.   Transpira mucho.   Tiene un aumento de peso repentino, 2,5 kg o ms en una semana.   Pierde peso, 1.5 kg o ms en una semana.   Su nivel de glucosa en sangre es elevado, necesita instrucciones.  SOLICITE ATENCIN MDICA DE  INMEDIATO SI:  Sufre una cefalea intensa.   Se marea o pierde el conocimiento   Presenta nuseas o vmitos.   Se siente desorientada confundida.   Sufre convulsiones.   Tiene problemas de visin.   Siente dolor en el estmago.   Presenta una hemorragia vaginal abundante.   Tiene contracciones uterinas.   Tiene una prdida importante de lquido por la vagina  DESPUS QUE NACE EL BEB:  Concurra a todos los controles de seguimiento y realice los anlisis de sangre segn las indicaciones de su mdico.   Mantenga un estilo de vida saludable para evitar la diabetes en el futuro. Aqu se incluye:   Siga el plan de alimentacin saludable.   Controle su peso.   Practique actividad fsica y descanse lo necesario.   No fume.   Amamante a su beb mientras pueda. Esto disminuir la probabilidad de que usted y su beb sufran diabetes posteriormente.  Para ms informacin acerca de la diabetes, visite la pgina web de la American Diabetes Association: www.americandiabetesassociation.org. Para ms informacin acerca de la diabetes gestacional cite la pgina web del American Congress of Obstetricians and Gynecologists en: www.acog.org. Document Released: 05/23/2005 Document Revised: 08/02/2011 ExitCare Patient Information 2012   [employer]. 

## 2012-03-24 NOTE — Progress Notes (Signed)
Transfer from [facility] Dr. Clinton Sawyer with early abnormal BS testing, c/w Class B DM. Will start daily testing QID, requests screenig for birth defects. Refer to [facility]. RLQ pain c/w round ligament pain

## 2012-03-24 NOTE — Progress Notes (Signed)
Diabetes Education:  Completed the review of the carbohydrate restricted diet for GDM , label reading and beginning carb counting with the assistance of the Spanish interpreter.  Provided handouts in Spanish;1. Nutrition, Diabetes and Pregnancy, 2. Carbohydrate counting.  Provided a True Track meter Kit [id number] Exp: 2014/03/26 and one box strips Lot: [id number]  ExP: 2014/05/26  And on box lancets Lot; [id number] EXP: 2016/01/28 . On return demonstration, her blood glucose fasting at mid-morning was 104 mg/dl.  Instructed to bring meter and log book to all clinic appointments.  Maggie Darrel Baroni, RN, RD, CDE

## 2012-04-07 ENCOUNTER — Ambulatory Visit (INDEPENDENT_AMBULATORY_CARE_PROVIDER_SITE_OTHER): Payer: Self-pay | Admitting: Advanced Practice Midwife

## 2012-04-07 ENCOUNTER — Encounter: Payer: Self-pay | Admitting: Advanced Practice Midwife

## 2012-04-07 VITALS — BP 131/82 | Temp 97.7°F | Wt 203.8 lb

## 2012-04-07 DIAGNOSIS — O99810 Abnormal glucose complicating pregnancy: Secondary | ICD-10-CM

## 2012-04-07 DIAGNOSIS — E669 Obesity, unspecified: Secondary | ICD-10-CM

## 2012-04-07 DIAGNOSIS — O24919 Unspecified diabetes mellitus in pregnancy, unspecified trimester: Secondary | ICD-10-CM

## 2012-04-07 LAB — POCT URINALYSIS DIP (DEVICE)
Ketones, ur: NEGATIVE mg/dL
Specific Gravity, Urine: >=1.03 (ref 1.005–1.030)
pH: 6 (ref 5.0–8.0)

## 2012-04-07 NOTE — Progress Notes (Signed)
Fasting: 16-109(604),  PC Breakfast: 54-098(119)  AC lunch: 84-119 PC lunch: 14-782(956, 215 cake) AC dinner dinner 95-142 (184) PC dinner  88-142 (185) Anatomy US ordered. Will try dietary changes one more week. Discussed most likely will need medications.

## 2012-04-07 NOTE — Progress Notes (Signed)
Pulse: 73

## 2012-04-07 NOTE — Patient Instructions (Addendum)
Gua de planeamiento de la alimentacin para diabticos (Diabetes Meal Planning Guide) La gua de planeamiento de alimentacin para diabticos es una herramienta para ayudarlo a planear sus comidas y colaciones. Es importante para las personas con diabetes controlar sus niveles de International aid/development worker. Elegir los [employer] correctos y las cantidades adecuadas durante el da le ayudar a Technical brewer. [place] bien puede incluso ayudarlo a mejorar la presin sangunea y Barista o Pharmacologist un peso saludable. CUENTE LOS HIDRATOS DE CARBONO CON FACILIDAD Cuando consume hidratos de carbono, stos se transforman en azcar (glucosa). Esto a su vez Counsellor de Production assistant, radio. El conteo de carbohidratos puede ayudarlo a Chief Operating Officer este nivel para que se sienta mejor. Al planear sus alimentos con el conteo de carbohidratos, podr tener ms flexibilidad en lo que come y Physiological scientist con el consumo de alimentos. El conteo de carbohidratos significa simplemente sumar la cantidad total de gramos de carbohidratos a sus comidas o colaciones. Trate de consumir la misma cantidad en cada comida. A continuacin encontrar una lista de 1 porcin o 15 gr. de carbohidratos. A continuacin se enumeran. Pregunte al mdico cuntos gramos de carbohidratos necesita comer en cada comida o colacin. Almidones y granos  1 [place] de pan.    bollo ingls o bollo para hamburguesa o hotdog.    taza de cereal fro (sin azcar).   ? taza de pasta o arroz cocido.    taza de vegetales que contengan almidn (maz, papas, arvejas, porotos, calabaza).   1 omelette (6 pulgadas).    bollo.   1 waffle o panqueque (del tamao de un CD).    taza de cereales cocidos.   4 a 6 galletas saldas pequeas.  *Se recomienda el consumo de granos enteros. Frutas  1 taza de frutos rojos, meln, papaya o anan sin azcar.   1 fruta fresca pequea.    banana o mango.    taza de jugo de frutas (4 onzas sin  endulzar).    taza de fruta envasada en jugo natural o agua.   2 cucharadas de frutas secas.   12-15 uvas o cerezas.  Leche y yogurt  1 taza de [employer] o al 1%.   1 taza de leche de soja.   6 onzas de yogurt descremado con edulcorante sin azcar.   6 onzas de yogur descremado de soja.   6 onzas de yogur natural.  Vegetales  1 taza de vegetales crudos o  de vegetales cocidos se considera cero carbohidratos o una comida "libre".   Si come 3 o ms porciones en una comida, cuntelas como 1 porcin de carbohidratos.  Otros carbohidratos   onzas de chips o pretzels.    taza de helado de crema o yogur helado.    taza de helado de agua.   5 cm de torta no congelada.   1 cucharada de miel, azcar, mermelada, jalea o almbar.   2 galletitas dulces pequeas.   3 cuadrados de crackers de graham.   3 tazas de palomitas de maz.   6 crackers.   1 taza de caldo.   Cuente 1 taza de guisado u otra mezcla de alimentos como 2 porciones de carbohidratos.   Los alimentos con menos de 20 caloras por porcin deben contarse como cero carbohidratos o alimento "libre".  Si lo desea compre un libro o software de computacin que enumere la cantidad de gramos de carbohidratos de los diferentes alimentos. Adems, el panel nutricional en las etiquetas de los  productos que consume es una buena fuente de informacin. Le indicar el tamao de la porcin y la cantidad total de carbohidratos que consumir por cada una. Divida este nmero por 15 para obtener el nmero de conteo de carbohidratos por porcin. Recuerde: cada porcin son 63 gramos de carbohidratos. PORCIONES La medicin de los alimentos y el tamao de las porciones lo ayudarn a Scientist, physiological cantidad exacta de comida que debe ingerir. La lista que sigue le mostrar el tamao de algunas porciones comunes.   1 onza.................4 dados apilados.   3 onzas..............Marland KitchenUn mazo de cartas.   1 cucharadita...Marland KitchenMarland KitchenLa punta de  un dedo pequeo.   1 cucharada.......Marland KitchenUn dedo.   2 cucharadas....Marland KitchenMarland KitchenUna pelota de golf.    taza..............Marland KitchenLa mitad de un puo.   1 taza...............Marland KitchenUn puo.  EJEMPLO DE PLAN DE ALIMENTACIN PARA DIABTICOS: A continuacin se muestra un ejemplo de plan de alimentacin que incluye comidas de los grupos de granos y [place], Sports administrator, frutas y carnes. Un nutricionista podr confeccionarle un plan individualizado para cubrir sus necesidades calricas y decirle el nmero de porciones que necesita de [place]. Sin embargo, podra [employer] alimentos que contengan carbohidratos (lcteos, cereales y frutas). Controlar la cantidad total de carbohidratos en los alimentos o colaciones es ms importante que asegurarse de incluir todos los grupos alimenticios cada vez que come.  El siguiente plan de alimentacin es un ejemplo de una dieta de 2000 caloras mediante el conteo de carbohidratos. Este plan contiene 17 porciones de carbohidratos. Desayuno  1 taza de avena (2 porciones de carbohidratos).    taza de yogur light(1 porcin de carbohidratos).   1 taza de arndanos (1 porcin de carbohidratos).    taza de almendras.  Colacin  1 manzana grande (2 porciones de carbohidratos).   1 palito de queso bajo [employer].  Almuerzo  Ensalada de pechuga de pollo.   1 taza de espinacas.    taza de tomates cortados.   2 oz (60 gr) de pechuga de pollo en rebanadas.   2 cucharadas de aderezo italiano bajo en [employer].   12 galletas integrales (2 porciones de carbohidratos).   12 a 15 uvas (1 porcin de carbohidratos).   1 taza de [employer] (1porcin de carbohidratos).  Colacin  1 taza de zanahorias.    taza de pur de garbanzos (1 porcin de carbohidratos).  Cena  3 oz (80 gr) de salmn a la parrilla.   1 taza de arroz integral (3 porciones de carbohidratos).  Colacin  1  taza de brcoli al vapor (1 porcin de carbohidrato) con una cucharadita de  aceite de oliva y jugo de limn.   1 taza de budn light (2 porciones de carbohidratos).  HOJA DE PLANEAMIENTO DE LA ALIMENTACIN: El dietista podr utilizar esta hoja para ayudarlo a decidir cuntas porciones y qu tipos de alimentos son los adecuados para usted.  DESAYUNO Grupo de alimentos y porciones / Alimento elegido Granos/Fculas_________________________________________________ Lcteos________________________________________________________ Rufina Falco ______________________________________________________ Lou Miner __________________________________________________________ Charlesetta Ivory _________________________________________________________ Rosalin Hawking _________________________________________________________ Lorin Mercy de alimentos y porciones / Alimento elegido Granos/Fculas___________________________________________________ Lcteos_________________________________________________________ Lou Miner ___________________________________________________________ Charlesetta Ivory __________________________________________________________ Rosalin Hawking __________________________________________________________ Danford Bad de alimentos y porciones / Alimento elegido Granos/Fculas___________________________________________________ Lcteos_________________________________________________________ Lou Miner ___________________________________________________________ Charlesetta Ivory __________________________________________________________ Rosalin Hawking __________________________________________________________ Jettie Pagan de alimentos y porciones / Alimento elegido Granos/Fculas_________________________________________________ Lcteos________________________________________________________ Rufina Falco ______________________________________________________ Lou Miner _________________________________________________________ Charlesetta Ivory ________________________________________________________ Rosalin Hawking  ________________________________________________________ Carolyn Stare DIARIOS Fculas_______________________________________________________ Vegetales _____________________________________________________ Lou Miner ________________________________________________________ Lcteos_______________________________________________________ Carnes________________________________________________________ Rosalin Hawking ________________________________________________________ Document Released: 11/20/2007 Document Revised: 08/02/2011 ExitCare Patient Information 2012 [place].

## 2012-04-14 ENCOUNTER — Ambulatory Visit (INDEPENDENT_AMBULATORY_CARE_PROVIDER_SITE_OTHER): Payer: Self-pay | Admitting: Family Medicine

## 2012-04-14 ENCOUNTER — Encounter: Payer: Self-pay | Admitting: Family Medicine

## 2012-04-14 ENCOUNTER — Encounter: Payer: Self-pay | Attending: Obstetrics & Gynecology | Admitting: Dietician

## 2012-04-14 VITALS — BP 108/70 | Temp 98.7°F | Wt 203.0 lb

## 2012-04-14 DIAGNOSIS — O99810 Abnormal glucose complicating pregnancy: Secondary | ICD-10-CM

## 2012-04-14 DIAGNOSIS — O0990 Supervision of high risk pregnancy, unspecified, unspecified trimester: Secondary | ICD-10-CM | POA: Insufficient documentation

## 2012-04-14 DIAGNOSIS — Z713 Dietary counseling and surveillance: Secondary | ICD-10-CM | POA: Insufficient documentation

## 2012-04-14 LAB — COMPREHENSIVE METABOLIC PANEL
Albumin: 3.4 g/dL — ABNORMAL LOW (ref 3.5–5.2)
BUN: 6 mg/dL (ref 6–23)
CO2: 22 mEq/L (ref 19–32)
Glucose, Bld: 83 mg/dL (ref 70–99)
Potassium: 3.8 mEq/L (ref 3.5–5.3)
Sodium: 138 mEq/L (ref 135–145)
Total Protein: 6.2 g/dL (ref 6.0–8.3)

## 2012-04-14 LAB — HEMOGLOBIN A1C
Hgb A1c MFr Bld: 5.3 % (ref <5.7)
Mean Plasma Glucose: 105 mg/dL (ref <117)

## 2012-04-14 LAB — POCT URINALYSIS DIP (DEVICE)
Glucose, UA: NEGATIVE mg/dL
Hgb urine dipstick: NEGATIVE
Ketones, ur: NEGATIVE mg/dL
Protein, ur: NEGATIVE mg/dL
Specific Gravity, Urine: 1.025 (ref 1.005–1.030)

## 2012-04-14 NOTE — Progress Notes (Signed)
Diabetes ED:  Provided on box of monitoring strips, Lot: [id number] Exp: 2014/06/26.  Dorothy Puffer, RD, CDE

## 2012-04-14 NOTE — Progress Notes (Signed)
FBS 95-127 all out of range 2 hr pp 81-140, most in range Will start glyburide at hs

## 2012-04-14 NOTE — Progress Notes (Signed)
Fetal Echo scheduled Sept. 16, 2013 at 11 am with Dr. Elizebeth Brooking. Patient given tel.# to make financial [phone]) Opthamology scheduled Sept. 30, 2013 at 930am with [facility].

## 2012-04-14 NOTE — Assessment & Plan Note (Signed)
Needs baseline labs Cr___, TSH___, HgbA1C___, 24 hour urine____, Fetal ECHO, Optho referral

## 2012-04-14 NOTE — Patient Instructions (Signed)
Diabetes mellitus gestacional (Gestational Diabetes Mellitus) La diabetes mellitus gestacional se produce slo durante el embarazo. Aparece cuando el organismo no puede controlar adecuadamente la glucosa (azcar) que aumenta en la sangre despus de comer. Durante el [place], se produce una resistencia a la insulina (sensibilidad reducida a la insulina) debido a la liberacin de hormonas por parte de la placenta. Generalmente, el pncreas de una mujer embarazada produce la cantidad suficiente de insulina para vencer esa resistencia. Sin embargo, en la diabetes gestacional, hay insulina pero no cumple su funcin adecuadamente. Si la resistencia es lo suficientemente grave como para que el pncreas no produzca la cantidad de insulina suficiente, la glucosa extra se acumula en la sangre.  Devota Pace RIESGO DE DESARROLLAR DIABETES GESTACIONAL?  Las mujeres con historia de diabetes en la familia.   Las mujeres de ms de [address]   Las que presentan sobrepeso.   Las [employer] pertenecen a ciertos grupos tnicos (latinas, afroamericanas, norteamericanas nativas, asiticas y las originarias de las islas del Pacfico.  QUE PUEDE OCURRIRLE AL BEB? Si el nivel de glucosa en sangre de la madre es demasiado elevado mientras este Maish [place], el nivel extra de azcar pasar por el cordn umbilical hacia el beb. Algunos de los problemas del beb pueden ser:  Beb demasiado grande: si el nio recibe Chief Strategy Officer, puede aumentar mucho de [place]. Esto puede hacer que sea demasiado grande para nacer por parto normal (vaginal) por lo que ser necesario realizar una cesrea.   Bajo nivel de glucosa (hipoglucemia): el beb produce insulina extra en respuesta a la excesiva cantidad de azcar que obtiene de [employer]. Cuando el beb nace y ya no necesita insulina extra, su nivel de azcar en sangre puede disminuir.   Ictericia (coloracin amarillenta de la piel y los ojos): esto es bastante frecuente en los  bebs. La causa es la acumulacin de una sustancia qumica denominada bilirrubina. No siempre es un trastorno grave, pero se observa con frecuencia en los bebs cuyas madres sufren diabetes gestacional.  RIESGOS PARA LA MADRE Las mujeres que han sufrido diabetes gestacional pueden tener ms riesgos para algunos problemas como:  Preeclampsia o toxemia, incluyendo problemas con hipertensin arterial. La presin arterial y los niveles de protenas en la orina deben controlarse con frecuencia.   Infecciones   Parto por cesrea.   Aparicin de diabetes tipo 2 en una etapa posterior de la vida. Alrededor del 30% al 50% sufrir diabetes posteriormente, especialmente las que son obesas.  DIAGNSTICO Las hormonas que causan resistencia a la insulina tienen su mayor nivel alrededor de las 24 a 28 semanas del Psychiatrist. Si se experimentan sntomas, stos son similares a los sntomas que normalmente aparecen durante el embarazo.  La diabetes mellitus gestacional generalmente se diagnostica por medio de un mtodo en dos partes: 1. Despus de la 24 a 28 semanas de Psychiatrist, la mujer debe beber una solucin que contiene glucosa y Education officer, environmental un anlisis de [place]. Si el nivel de glucosa es elevado, la realizarn un segundo Soldier.  2. La prueba oral de tolerancia a la glucosa, que dura aproximadamente tres horas. Despus de realizar ayuno durante la noche, se controla nivel de glucosa en sangre. La mujer bebe una solucin que contiene glucosa y Chief Executive Officer realizan anlisis de glucosa en sangre cada hora.  Si la mujer tiene factores de riesgos para la diabetes mellitus gestacional, el mdico podr Programme researcher, broadcasting/film/video anlisis antes de las 24 semanas de [place]. TRATAMIENTO El tratamiento est dirigido a Insurance underwriter en  sangre de la madre en un nivel normal y puede incluir:  La planificacin de los alimentos.   Recibir insulina u otro medicamento para controlar el nivel de glucosa en sangre.   La prctica de ejercicios.    Llevar un registro diario de los alimentos que consume.   Control y registro de los niveles de glucosa en sangre.   Control de los niveles de cetona en la orina, aunque esto ya no se considera necesario en la mayora de los embarazos.  INSTRUCCIONES PARA EL CUIDADO DOMICILIARIO Mientras est embarazada:  Siga los consejos de su mdico relacionados con los controles prenatales, la planificacin de la comida, la actividad fsica, los medicamentos, vitaminas, los anlisis de sangre y otras pruebas y las actividades fsicas.   Lleve un registro de las comidas, las pruebas de glucosa en sangre y la cantidad de insulina que recibe (si corresponde). Muestre todo al profesional en cada consulta mdica prenatal.   Si sufre diabetes mellitus gestacional, podr tener problemas de hipoglucemia (nivel bajo de glucosa en sangre). Podr sospechar este problema si se siente repentinamente mareada, tiene temblores y/o se siente dbil. Si cree que esto le est ocurriendo, y tiene un medidor de glucosa, mida su nivel de glucosa en sangre. Siga los consejos de su mdico sobre el modo y el momento de tratar su nivel de glucosa en sangre. Generalmente se sigue la regla 15:15 Consuma 15 g de hidratos de carbono, espere 15 minutos y vuelva controlar el nivel de glucosa en sangre.. Ejemplos de 15 g de hidratos de carbono son:   1 taza de leche descremada.    taza de jugo.   3-4 tabletas de glucosa.   5-6 caramelos duros.   1 caja pequea de pasas de uva.    taza de gaseosa comn.   Mantenga una buena higiene para evitar infecciones.   No fume.  SOLICITE ATENCIN MDICA SI:  Observa prdida vaginal con o sin picazn.   Se siente ms dbil o cansada que lo habitual.   Transpira mucho.   Tiene un aumento de peso repentino, 2,5 kg o ms en una semana.   Pierde peso, 1.5 kg o ms en una semana.   Su nivel de glucosa en sangre es elevado, necesita instrucciones.  SOLICITE ATENCIN MDICA DE  INMEDIATO SI:  Sufre una cefalea intensa.   Se marea o pierde el conocimiento   Presenta nuseas o vmitos.   Se siente desorientada confundida.   Sufre convulsiones.   Tiene problemas de visin.   Siente dolor en el estmago.   Presenta una hemorragia vaginal abundante.   Tiene contracciones uterinas.   Tiene una prdida importante de lquido por la vagina  DESPUS QUE NACE EL BEB:  Concurra a todos los controles de seguimiento y realice los anlisis de sangre segn las indicaciones de su mdico.   Mantenga un estilo de vida saludable para evitar la diabetes en el futuro. Aqu se incluye:   Siga el plan de alimentacin saludable.   Controle su peso.   Practique actividad fsica y descanse lo necesario.   No fume.   Amamante a su beb mientras pueda. Esto disminuir la probabilidad de que usted y su beb sufran diabetes posteriormente.  Para ms informacin acerca de la diabetes, visite la pgina web de la American Diabetes Association: www.americandiabetesassociation.org. Para ms informacin acerca de la diabetes gestacional cite la pgina web del American Congress of Obstetricians and Gynecologists en: www.acog.org. Document Released: 05/23/2005 Document Revised: 08/02/2011 ExitCare Patient Information 2012   ExitCare[employer]. Embarazo - Segundo trimestre (Pregnancy - Second Trimester) El segundo trimestre del Psychiatrist (del 3 al ) es un perodo de evolucin rpida para usted y el beb. Hacia el final del sexto mes, el beb mide aproximadamente 23 cm y pesa 680 g. Comenzar a Pharmacologist del beb [place] 18 y las 20 [address] de [place]. Podr sentir las pataditas ("quickening en ingls"). Hay un rpido [employer]. Puede segregar un lquido claro Charity fundraiser) de las [place]. Quizs sienta pequeas contracciones en el vientre (tero) Esto se conoce como falso trabajo de parto o contracciones de Braxton-H[place]. Es como una prctica del trabajo de parto que se  produce cuando el beb est listo para salir. Generalmente los problemas de vmitos matinales ya se han superado hacia el final del Medical sales representative. Algunas mujeres desarrollan pequeas manchas oscuras (que se denominan cloasma, mscara del embarazo) en la cara que normalmente se van luego del nacimiento del beb. La exposicin al sol empeora las manchas. Puede desarrollarse acn en algunas mujeres embarazadas, y puede desaparecer en aquellas que ya tienen acn. EXAMENES PRENATALES  Durante los Man[employer], deber seguir realizando pruebas de [place], segn avance el [place]. Estas pruebas se realizan para controlar su salud y la del beb. Tambin se realizan anlisis de sangre para [employer] niveles de [place]. La anemia (bajo nivel de hemoglobina) es frecuente durante el embarazo. Para prevenirla, se administran hierro y vitaminas. Tambin se le realizarn exmenes para saber si tiene diabetes entre las 24 y las 28 semanas del [place]. Podrn repetirle algunas de las Hov[employer] Enterprises hicieron previamente.   En cada visita le medirn el tamao del tero. Esto se realiza para asegurarse de que el beb est creciendo correctamente de acuerdo al estado del [place].   Tambin en cada visita prenatal controlarn su presin arterial. Esto se realiza para asegurarse de que no tenga toxemia.   Se controlar su orina para asegurarse de que no tenga infecciones, diabetes o protena en la orina.   Se controlar su peso regularmente para asegurarse que el aumento ocurre al ritmo indicado. Esto se hace para asegurarse que usted y el beb tienen una evolucin normal.   En algunas ocasiones se realiza una prueba de ultrasonido para confirmar el correcto desarrollo y evolucin del beb. Esta prueba se realiza con ondas sonoras inofensivas para el beb, de modo que el profesional pueda calcular ms precisamente la fecha del [place].  Algunas veces se realizan pruebas especializadas del lquido amnitico  que rodea al beb. Esta prueba se denomina amniocentesis. El lquido amnitico se obtiene introduciendo una aguja en el vientre (abdomen). Se realiza para Conservator, museum/gallery en los que existe alguna preocupacin acerca de algn problema gentico que pueda sufrir el beb. En ocasiones se lleva a cabo cerca del final del embarazo, si es necesario inducir al Apple Computer. En este caso se realiza para asegurarse que los pulmones del beb estn lo suficientemente maduros como para que pueda vivir fuera del tero. CAMBIOS QUE OCURREN EN EL SEGUNDO TRIMESTRE DEL EMBARAZO Su organismo atravesar numerosos cambios durante el [employer]. Estos pueden variar de Neomia Dear persona a otra. [place] con el profesional que la asiste acerca los cambios que usted note y que la preocupen.  Durante el segundo trimestre probablemente sienta un aumento del apetito. Es normal tener "antojos" de Development worker, community. Esto vara de Neomia Dear persona a otra y de un embarazo a Therapist, art.   El abdomen inferior comenzar a abultarse.   Podr  tener la necesidad de Geographical information systems officer con ms frecuencia debido a que el tero y el beb presionan sobre la vejiga. Tambin es frecuente contraer ms infecciones urinarias durante el embarazo (dolor al ConocoPhillips). Puede evitarlas bebiendo gran cantidad de lquidos y vaciando la vejiga antes y despus de Sales promotion account executive.   Podrn aparecer las primeras estras en las caderas, abdomen y [place]. Estos son cambios normales del cuerpo durante el [place]. No existen medicamentos ni ejercicios que puedan prevenir [employer].   Es posible que comience a desarrollar venas inflamadas y abultadas (varices) en las piernas. El uso de medias de descanso, Optometrist sus pies durante 15 minutos, 3 a 4 veces al da y Film/video editor la sal en su dieta ayuda a Journalist, newspaper.   Podr sentir Engineering geologist gstrica a medida que el tero crece y Doctor, general practice. Puede tomar anticidos, con la autorizacin de su mdico,  para Financial planner. Tambin es til ingerir pequeas comidas 4 a 5 veces al Futures trader.   La constipacin puede tratarse con un laxante o agregando fibra a su dieta. Beber grandes cantidades de lquidos, comer vegetales, frutas y granos integrales es de [place].   Tambin es beneficioso practicar actividad fsica. Si ha sido una persona Engineer, mining, podr continuar con la Harley[employer]son de las actividades durante el mismo. Si ha sido [employer], puede ser beneficioso que comience con un programa de ejercicios, Museum/gallery exhibitions officer.   Puede desarrollar hemorroides (vrices en el recto) hacia el final del segundo trimestre. Tomar baos de asiento tibios y Chemical engineer cremas recomendadas por el profesional que lo asiste sern de ayuda para los problemas de hemorroides.   Tambin podr Financial risk analyst de espalda durante este momento de su embarazo. Evite levantar objetos pesados, utilice zapatos de taco bajo y [place] buena postura para ayudar a reducir los problemas de [place].   Algunas mujeres embarazadas desarrollan hormigueo y adormecimiento de la mano y los dedos debido a la hinchazn y compresin de los ligamentos de la mueca (sndrome del tnel carpiano). Esto desaparece una vez que el beb nace.   Como sus pechos se agrandan, Pension scheme manager un sujetador ms grande. Use un sostn de soporte, cmodo y de algodn. No utilice un sostn para amamantar hasta el ltimo mes de embarazo si va a amamantar al beb.   Podr observar una lnea oscura desde el ombligo hacia la zona pbica denominada linea nigra.   Podr observar que sus mejillas se ponen coloradas debido al aumento de flujo sanguneo en la cara.   Podr desarrollar "araitas" en la cara, cuello y pecho. Esto desaparece una vez que el beb nace.  INSTRUCCIONES PARA EL CUIDADO DOMICILIARIO  Es extremadamente importante que evite el cigarrillo, hierbas medicinales, alcohol y las drogas no prescriptas durante el Psychiatrist. Estas  sustancias qumicas afectan la formacin y el desarrollo del beb. Evite estas sustancias durante todo el embarazo para asegurar el nacimiento de un beb sano.   La mayor parte de los cuidados que se aconsejan son los mismos que los indicados para Financial risk analyst trimestre del Psychiatrist. Cumpla con las citas tal como se le indic. Siga las instrucciones del profesional que lo asiste con respecto al uso de los medicamentos, el ejercicio y Psychologist, forensic.   Durante el embarazo debe obtener nutrientes para usted y para su beb. Consuma alimentos balanceados a intervalos regulares. Elija alimentos como carne, pescado, [place] y otros productos lcteos descremados, vegetales, frutas, panes integrales y cereales. El Equities trader  cul es el aumento de peso ideal.   Las relaciones sexuales fsicas pueden continuarse hasta cerca del fin del embarazo si no existen otros problemas. Estos problemas pueden ser la prdida temprana (prematura) de lquido amnitico de las [place], sangrado vaginal, dolor abdominal u otros problemas mdicos o del Psychiatrist.   Realice Tesoro Corporation, si no tiene restricciones. Consulte con el profesional que la asiste si no sabe con certeza si determinados ejercicios son seguros. El mayor aumento de peso tiene Environmental consultant durante los ltimos 2 trimestres del Psychiatrist. El ejercicio la ayudar a:   Engineering geologist.   Ponerla en forma para el parto.   Ayudarla a perder peso luego de haber dado a luz.   Use un buen sostn o como los que se usan para hacer deportes para Paramedic la sensibilidad de las [place]. Tambin puede serle til si lo [place] mientras duerme. Si pierde Product manager, podr [employer]in.   No utilice la baera con agua caliente, baos turcos y saunas durante el [address]   Utilice el cinturn de seguridad sin excepcin cuando conduzca. Este la proteger a usted y al beb en caso de accidente.   Evite comer carne cruda, queso crudo, y el contacto  con los utensilios y desperdicios de los gatos. Estos elementos contienen grmenes que pueden causar defectos de nacimiento en el beb.   El segundo trimestre es un buen momento para visitar a su dentista y Software engineer si an no lo ha hecho. Es Primary school teacher los dientes limpios. Utilice un cepillo de dientes blando. Cepllese ms suavemente durante el embarazo.   Es ms fcil perder algo de orina durante el [place]. Apretar y Chief Operating Officer los msculos de la pelvis la ayudar con este problema. Practique detener la miccin cuando est en el bao. Estos son los mismos msculos que Development worker, international aid. Son [employer] Partners mismos msculos que utiliza cuando trata de [employer] gases. Puede practicar apretando estos msculos 10 veces, y repetir esto 3 veces por da aproximadamente. Una vez que conozca qu msculos debe apretar, no realice estos ejercicios durante la miccin. Puede favorecerle una infeccin si la orina vuelve hacia atrs.   Pida ayuda si tiene necesidades econmicas, de asesoramiento o nutricionales durante el [place]. El profesional podr ayudarla con respecto a estas necesidades, o derivarla a otros especialistas.   La piel puede ponerse grasa. Si esto sucede, lvese la cara con un jabn [place], utilice un humectante no graso y [place] con base de aceite o crema.  CONSUMO DE MEDICAMENTOS Y DROGAS DURANTE EL EMBARAZO  Contine tomando las vitaminas apropiadas para esta etapa tal como se le indic. Las vitaminas deben contener un miligramo de cido flico y deben suplementarse con hierro. Guarde todas las vitaminas fuera del alcance de los nios. La ingestin de slo un par de vitaminas o tabletas que contengan hierro puede ocasionar la [employer] Mining en un beb o en un nio pequeo.   Evite el uso de [place], inclusive los de venta libre y hierbas que no hayan sido prescriptos o indicados por el profesional que la asiste. Algunos medicamentos pueden causar problemas fsicos al  beb. Utilice los medicamentos de venta libre o de prescripcin para Chief Technology Officer, Environmental health practitioner o la [place], segn se lo indique el profesional que lo asiste. No utilice aspirina.   El consumo de alcohol est relacionado con ciertos defectos de nacimiento. Esto incluye el sndrome de alcoholismo fetal. Debe evitar el consumo de alcohol en cualquiera de sus  formas. El cigarrillo causa nacimientos prematuros y bebs de [place]. El uso de drogas recreativas est absolutamente prohibido. Son muy nocivas para el beb. Un beb que nace de [employer], ser adicto al nacer. Ese beb tendr los mismos sntomas de abstinencia que un adulto.   Infrmele al profesional si consume alguna droga.   No consuma drogas ilegales. Pueden causarle mucho dao al beb.  SOLICITE ATENCIN MDICA SI: Tiene preguntas o preocupaciones durante su embarazo. Es mejor que llame para Science writer las dudas que esperar hasta su prxima visita prenatal. Thressa Sheller forma se sentir ms tranquila.  SOLICITE ATENCIN MDICA DE INMEDIATO SI:  La temperatura oral se eleva sin motivo por encima de 102 F (38.9 C) o segn le indique el profesional que lo asiste.   Tiene una prdida de lquido por la vagina (canal de parto). Si sospecha una ruptura de las [place], tmese la temperatura y llame al profesional para informarlo sobre esto.   Observa unas pequeas manchas, una hemorragia vaginal o elimina cogulos. Notifique al profesional acerca de la cantidad y de cuntos apsitos est utilizando. Unas pequeas manchas de sangre son algo comn durante el Psychiatrist, especialmente despus de Sales promotion account executive.   Presenta un olor desagradable en la secrecin vaginal y observa un cambio en el color, de transparente a blanco.   Contina con las nuseas y no obtiene alivio de los remedios indicados. Vomita sangre o algo similar a la borra del caf.   [place] o sube ms de 900 g. en una semana, o segn lo indicado por el profesional que la  asiste.   Observa que se le Southwest Airlines, las manos, los pies o las piernas.   Ha estado expuesta a la rubola y no ha sufrido la enfermedad.   Ha estado expuesta a la quinta enfermedad o a la varicela.   Presenta dolor abdominal. Las molestias en el ligamento redondo son Neomia Dear causa no cancerosa (benigna) frecuente de dolor abdominal durante el embarazo. El profesional que la asiste deber evaluarla.   Presenta dolor de cabeza intenso que no se [place].   Presenta fiebre, diarrea, dolor al orinar o le falta la respiracin.   Presenta dificultad para ver, visin borrosa, o visin doble.   Sufre una cada, un accidente de trnsito o cualquier tipo de trauma.   Vive en un hogar en el que existe violencia fsica o mental.  Document Released: 05/23/2005 Document Revised: 08/02/2011 [facility] Patient Information 2012 [place], [place]. Amamantar al beb (Breastfeeding) LOS BENEFICIOS DE AMAMANTAR Para el beb  La primera leche (calostro ) ayuda al mejor funcionamiento del sistema digestivo del beb.   La leche tiene anticuerpos que provienen de la madre y que ayudan a prevenir las infecciones en el beb.   Hay una menor incidencia de asma, enfermedades alrgicas y SMSI (sndrome de muerte sbita nfantil).   Los nutrientes que contiene la [place] materna son mejores que las frmulas para el bibern y favorecen el desarrollo cerebral.   Los bebs amamantados sufren menos gases, clicos y constipacin.  Para la mam  La lactancia materna favorece el desarrollo de un vnculo muy especial entre la madre y el beb.   Es ms conveniente, siempre disponible a la Optician, dispensing y ms econmica que la [employer].   Consume caloras en la madre y la ayuda a perder el peso ganado durante el [place].   Favorece la contraccin del tero a su tamao normal, de manera ms rpida y [employer]urant hemorragias luego  del parto.   Las [employer] Holdings que amamantan tienen menor riesgo de  Geophysical data processor de mama.  AMAMNTELO CON FRECUENCIA  Un beb sano, nacido a trmino, puede amamantarse con tanta frecuencia como cada hora, o espaciar las comidas cada tres horas.   Esta frecuencia variar de un beb a otro. Observe al beb cuando manifieste signos de hambre, antes que regirse por el reloj.   Amamntelo tan seguido como el beb lo solicite, o cuando usted sienta la necesidad de Paramedic sus [place].   Despierte al beb si han pasado 3  4 horas desde la ltima comida.   El amamantamiento frecuente la ayudar a producir ms [place] y a Education officer, community de Engineer, mining en los pezones e hinchazn de las [place].  LA POSICIN DEL BEB PARA AMAMANTARLO  Ya sea que se encuentre acostada o sentada, asegrese que el abdomen del beb enfrente el suyo.   Sostenga la mama con el pulgar por arriba y el resto de los dedos por debajo. Asegrese que sus dedos se encuentren lejos del pezn y de la boca del beb.   Toque suavemente los labios del beb y la mejilla ms cercana a la mama con el dedo o el pezn.   Cuando la boca del beb se abra lo suficiente, introduzca el pezn y la zona oscura que lo rodea tanto como le sea posible dentro de la boca.   Coloque a beb cerca suyo de modo que su nariz y mejillas toquen las mamas al [employer].  LAS COMIDAS  La duracin de cada comida vara de un beb a otro y de [place] comida a [place].   El beb debe succionar alrededor American Financial o tres minutos para que le llegue [place]. Esto se denomina "bajada". Por este motivo, permita que el nio se alimente en cada mama todo lo que desee. Terminar de mamar cuando haya recibido la cantidad [place] de nutrientes.   Para detener la succin coloque su dedo en la comisura de la boca del nio y Midwife entre sus encas antes de quitarle la mama de la boca. Esto la ayudar a English as a second language teacher.  REDUCIR LA CONGESTIN DE LAS MAMAS  Durante la primera semana despus del parto, usted puede experimentar Mattel. Cuando las mamas estn congestionadas, se sienten calientes, llenas y molestas al tacto. Puede reducir la congestin si:   Lo amamanta frecuentemente, cada 2-3 horas. Las mams que [employer] Corporation pronto y con frecuencia tienen menos problemas de [address].   Coloque bolsas fras livianas entre cada [place]. Esto ayuda a Building services engineer. Envuelva las bolsas de hielo en una toalla liviana para proteger su piel.   Aplique compresas hmedas calientes [employer] durante 5 a 10 minutos antes de amamantar al [employer]. Esto aumenta la circulacin y [place] a que la [place].   Masajee suavemente la mama antes y Psychologist, sport and exercise.   Asegrese que el nio vaca al menos una mama antes de cambiar de lado.   Use un sacaleche para vaciar la mama si el beb se duerme o no se alimenta bien. Tambin podr [employer] con esta bomba si tiene que volver al trabajo o siente que las mamas estn congestionadas.   Evite los biberones, chupetes o complementar la alimentacin con agua o jugos en lugar de la [place].   Verifique que el beb se encuentra en la posicin correcta mientras lo alimenta.   Evite el cansancio, el estrs y la anemia  Use un soutien que sostenga bien sus mamas y evite los que tienen aro.   Consuma una dieta balanceada y beba lquidos en cantidad.  Si sigue estas indicaciones, la congestin debe mejorar en 24 a 48 horas. Si an tiene dificultades, consulte a Barista. TENDR SUFICIENTE LECHE MI BEB? Algunas veces las madres se preocupan acerca de si sus bebs tendrn la leche suficiente. Puede asegurarse que el beb tiene la leche suficiente si:  El beb succiona y escucha que traga activamente.   El nio se alimenta al menos 8 a 12 veces en 24 horas. Alimntelo hasta que se desprenda por sus propios medios o se quede dormido en la primera mama (al menos durante 10 a 20 minutos), luego ofrzcale el otro lado.   El beb moja 5 a 6 paales  descartables (6 a 8 paales de tela) en 24 horas cuando tiene 5  6 das de vida.   Tiene al menos 2-3 deposiciones todos los [employer] primeros meses. La leche materna es todo el alimento que el beb necesita. No es necesario que el nio ingiera agua o preparados de bibern. De hecho, para ayudar a que sus mamas produzcan ms [place], lo mejor es no darle al beb suplementos durante las primeras semanas.   La materia fecal debe ser blanda y [place].   El beb debe aumentar 112 a 196 g por semana.  CUDESE Cuide sus mamas del siguiente modo:  Bese o dchese diariamente.   No lave sus pezones con jabn.   Comience a amamantar del lado izquierdo en una comida y del lado derecho en la siguiente.   Notar que [employer] suministro de [place] a los 2 a 5 [address]  [address] despus del [place]. Puede sentir algunas molestias por la congestin, lo que hace que sus mamas estn duras y sensibles. La congestin disminuye en 24 a 48 horas. Mientras tanto, aplique toallas hmedas calientes durante 5 a 10 minutos antes de amamantar. Un masaje suave y la extraccin de un poco de leche antes de Museum/gallery exhibitions officer ablandarn las mamas y har ms fcil que el beb se agarre. Use un buen sostn y seque al aire los pezones durante 10 a 15 minutos luego de cada alimentacin.   Solo utilice apsitos de algodn.   Utilice lanolina [employer] pezones luego de [place]. No necesita lavarlos luego de alimentar al [employer].  Cudese del siguiente modo:   Consuma alimentos bien balanceados y refrigerios nutritivos.   Dixie Dials, jugos de fruta y agua para Warehouse manager sed (alrededor de 8 vasos por Futures trader).   Descanse lo suficiente.   Aumente la ingesta de calcio en la dieta (1200mg /da).   Evite los alimentos que usted nota que puedan afectar al beb.  SOLICITE ATENCIN MDICA SI:  Tiene preguntas que formular o dificultades con la alimentacin a pecho.   Necesita ayuda.   Observa una zona dura, roja y que le duele en la zona  de la mama, y se acompaa de fiebre de 100.5 F (38.1 C) o ms.   El beb est muy somnoliento como para alimentarse bien o tiene problemas para dormir.   El beb moja menos de 6 paales por da, a partir de los [address] de [place].   La piel del beb o la parte blanca de sus ojos est ms amarilla de lo que estaba en el hospital.   Se siente deprimida.  Document Released: 08/13/2005 Document Revised: 08/02/2011 [facility] Patient Information 2012 [place], [place].

## 2012-04-14 NOTE — Progress Notes (Signed)
Pulse 66. C/o intermittent "pins and needles" sensation in the vagina.

## 2012-04-16 ENCOUNTER — Encounter: Payer: Self-pay | Admitting: Family Medicine

## 2012-04-16 LAB — PROTEIN, URINE, 24 HOUR: Protein, 24H Urine: 66 mg/d (ref 50–100)

## 2012-04-21 ENCOUNTER — Encounter: Payer: Self-pay | Admitting: Family Medicine

## 2012-04-21 ENCOUNTER — Ambulatory Visit (INDEPENDENT_AMBULATORY_CARE_PROVIDER_SITE_OTHER): Payer: Self-pay | Admitting: Obstetrics and Gynecology

## 2012-04-21 VITALS — BP 101/66 | Temp 97.5°F | Wt 204.0 lb

## 2012-04-21 DIAGNOSIS — O34219 Maternal care for unspecified type scar from previous cesarean delivery: Secondary | ICD-10-CM

## 2012-04-21 DIAGNOSIS — Z98891 History of uterine scar from previous surgery: Secondary | ICD-10-CM

## 2012-04-21 DIAGNOSIS — E669 Obesity, unspecified: Secondary | ICD-10-CM

## 2012-04-21 DIAGNOSIS — O0990 Supervision of high risk pregnancy, unspecified, unspecified trimester: Secondary | ICD-10-CM

## 2012-04-21 DIAGNOSIS — E7800 Pure hypercholesterolemia, unspecified: Secondary | ICD-10-CM

## 2012-04-21 DIAGNOSIS — O24919 Unspecified diabetes mellitus in pregnancy, unspecified trimester: Secondary | ICD-10-CM

## 2012-04-21 DIAGNOSIS — O99810 Abnormal glucose complicating pregnancy: Secondary | ICD-10-CM

## 2012-04-21 LAB — POCT URINALYSIS DIP (DEVICE)
Bilirubin Urine: NEGATIVE
Hgb urine dipstick: NEGATIVE
Ketones, ur: NEGATIVE mg/dL
Nitrite: NEGATIVE
Specific Gravity, Urine: 1.025 (ref 1.005–1.030)
pH: 6 (ref 5.0–8.0)

## 2012-04-21 MED ORDER — GLYBURIDE 2.5 MG PO TABS: 2.5000 mg | ORAL_TABLET | Freq: Every day | ORAL | Status: DC

## 2012-04-21 NOTE — Progress Notes (Signed)
P-64 

## 2012-04-21 NOTE — Progress Notes (Signed)
FBS 88-118 (majority out of range) 2hr pp 80-179 (3/18 out of range). Will start patient on Glyburide 2.5 mg qHS. Patient reminded of her anatomy ultrasound on Wednesday and her fetal echo on 9/16

## 2012-04-23 ENCOUNTER — Ambulatory Visit (HOSPITAL_COMMUNITY)
Admission: RE | Admit: 2012-04-23 | Discharge: 2012-04-23 | Disposition: A | Discharge status: Home / Self Care | Payer: Self-pay | Source: Ambulatory Visit | Attending: Advanced Practice Midwife | Admitting: Advanced Practice Midwife

## 2012-04-23 DIAGNOSIS — Z1389 Encounter for screening for other disorder: Secondary | ICD-10-CM | POA: Insufficient documentation

## 2012-04-23 DIAGNOSIS — O99810 Abnormal glucose complicating pregnancy: Secondary | ICD-10-CM | POA: Insufficient documentation

## 2012-04-23 DIAGNOSIS — Z363 Encounter for antenatal screening for malformations: Secondary | ICD-10-CM | POA: Insufficient documentation

## 2012-04-23 DIAGNOSIS — E669 Obesity, unspecified: Secondary | ICD-10-CM | POA: Insufficient documentation

## 2012-04-23 DIAGNOSIS — O358XX0 Maternal care for other (suspected) fetal abnormality and damage, not applicable or unspecified: Secondary | ICD-10-CM | POA: Insufficient documentation

## 2012-05-05 ENCOUNTER — Ambulatory Visit (INDEPENDENT_AMBULATORY_CARE_PROVIDER_SITE_OTHER): Payer: Self-pay | Admitting: Obstetrics & Gynecology

## 2012-05-05 ENCOUNTER — Encounter: Payer: Self-pay | Attending: Obstetrics & Gynecology | Admitting: Dietician

## 2012-05-05 VITALS — BP 110/69 | Temp 98.5°F | Wt 202.6 lb

## 2012-05-05 DIAGNOSIS — Z713 Dietary counseling and surveillance: Secondary | ICD-10-CM | POA: Insufficient documentation

## 2012-05-05 DIAGNOSIS — O99810 Abnormal glucose complicating pregnancy: Secondary | ICD-10-CM | POA: Insufficient documentation

## 2012-05-05 DIAGNOSIS — O0990 Supervision of high risk pregnancy, unspecified, unspecified trimester: Secondary | ICD-10-CM

## 2012-05-05 DIAGNOSIS — Z98891 History of uterine scar from previous surgery: Secondary | ICD-10-CM

## 2012-05-05 DIAGNOSIS — O34219 Maternal care for unspecified type scar from previous cesarean delivery: Secondary | ICD-10-CM

## 2012-05-05 LAB — POCT URINALYSIS DIP (DEVICE)
Bilirubin Urine: NEGATIVE
Hgb urine dipstick: NEGATIVE
Protein, ur: NEGATIVE mg/dL
Specific Gravity, Urine: 1.02 (ref 1.005–1.030)
pH: 7 (ref 5.0–8.0)

## 2012-05-05 NOTE — Patient Instructions (Signed)
Eleccin del mtodo anticonceptivo  (Contraception Choices) La anticoncepcin (control de la natalidad) es el uso de cualquier mtodo o dispositivo para Location manager. A continuacin se indican algunos de esos mtodos.  MTODOS HORMONALES   Implante anticonceptivo. Es un tubo plstico delgado que contiene la hormona progesterona. No contiene estrgenos. El mdico inserta el tubo en la parte interna del brazo. El tubo puede Geneticist, molecular durante 3 aos. Despus de los 3 aos debe retirarse. El implante impide que los ovarios liberen vulos (ovulacin), espesa el moco cervical, lo que evita que los espermatozoides ingresen al tero y hace ms delgada la membrana que cubre el interior del tero.   Inyecciones de progesterona sola. Estas inyecciones se administran cada 3 meses para evitar el embarazo. La progesterona sinttica impide que los ovarios liberen vulos. Tambin hace que el moco cervical se espese y modifica el recubrimiento interno del tero. Esto hace ms difcil que los espermatozoides sobrevivan en el tero.   Pldoras anticonceptivas. Las pldoras anticonceptivas contienen estrgenos y Education officer, museum. Actan impidiendo que el vulo se forme en el ovario(ovulacin). Las pldoras anticonceptivas son recetadas por el mdico.Tambin se utilizan para tratar los perodos menstruales abundantes.   Minipldora. Este tipo de pldora anticonceptiva contiene slo hormona progesterona. Deben tomarse todos los [address]  [address] del mes y debe recetarlas el mdico.   Parches anticonceptivos. El parche contiene hormonas similares a las que contienen las pldoras anticonceptivas. Deben cambiarse una vez por semana y se utilizan bajo prescripcin mdica.   Anillo vaginal. Anillo vaginal contiene hormonas similares a las que contienen las pldoras anticonceptivas. Se deja colocado durante tres semanas, se lo retira durante 1 semana y luego se coloca uno nuevo. La paciente debe sentirse cmoda para insertar  y retirar el anillo de la vagina.Es necesaria la receta del mdico.   Anticonceptivos de Associate Professor. Los anticonceptivos de emergencia son mtodos para evitar un embarazo despus de una relacin sexual sin proteccin. Esta pldora puede tomarse inmediatamente despus de Child psychotherapist sexuales o hasta 5 [place] de haber tenido sexo sin proteccin. Es ms efectiva si se toma poco tiempo despus. Los anticonceptivos de emergencia estn disponibles sin prescripcin mdica. Consltelo con su farmacutico. No use los anticonceptivos de emergencia como nico mtodo anticonceptivo.  MTODOS DE BARRERA   Condn masculino. Es una vaina delgada (ltex o goma) que se [place] en el pene durante el acto sexual. Deri Fuelling con espermicida para aumentar la efectividad.   Condn femenino. Es una vaina blanda y floja que se adapta suavemente a la vagina antes de las relaciones sexuales.   Diafragma. Es una barrera de ltex redonda y Casimer Bilis que debe ser ajustada por un profesional. Se inserta en la vagina, junto con un gel espermicida. Debe insertarse antes de Management consultant. Debe dejar el diafragma colocado en la vagina durante 6 a 8 horas despus de la relacin sexual.   Capuchn cervical. Es una taza de ltex o plstico, redonda y [place] que cubre el cuello del tero y debe ser ajustada por un mdico. Puede dejarlo colocado en la vagina hasta 48 horas despus de las Clinical research associate.   Esponja. Es una pieza blanda y circular de espuma de poliuretano. Contiene un espermicida. Se inserta en la vagina despus de mojarla y antes de las [employer].   Espermicidas. Los espermicidas son qumicos que matan o bloquean el esperma y no lo dejan ingresar al cuello del tero y al tero. Vienen en forma de cremas, geles, supositorios, espuma o comprimidos. No es  necesario tener receta mdica. Se insertan en la vagina con un aplicador antes de Management consultant. El proceso debe repetirse cada vez que  tiene relaciones sexuales.  ANTICONCEPTIVOS INTRAUTERINOS   Dispositivo intrauterino (DIU). Es un dispositivo en forma de T que se coloca en el tero durante el perodo menstrual, para Location manager. Hay dos tipos:   DIU de cobre. Este tipo de DIU est recubierto con un alambre de cobre y se inserta dentro del tero. El cobre hace que el tero y las trompas de Falopio produzcan un liquido que [employer] espermatozoides. Puede permanecer colocado durante 10 aos.   DIU hormonal. Este tipo de DIU contiene la hormona progestina (progesterona sinttica). La hormona espesa el moco cervical y evita que los espermatozoides ingresen al tero y tambin afina la membrana que cubre el tero para evitar la implantacin del vulo fertilizado. La hormona debilita o destruye los espermatozoides que ingresan al tero. Puede permanecer colocado durante 5 aos.  MTODOS ANTICONCEPTIVOS PERMANENTES   Ligadura de trompas en la mujer. La ligadura de trompas en la mujer se realiza sellando, atando u obstruyendo quirrgicamente las trompas de Falopio lo que impide que el vulo descienda hacia el tero.   Esterilizacin masculina. Se realiza atando los conductos por los que pasan los espermatozoides (vasectoma).Esto impide que el esperma ingrese a la vagina durante el acto sexual. Luego del procedimiento, el hombre puede eyacular lquido (semen).  MTODOS DE PLANIFICACIN NATURAL   Planificacin familiar natural.  Consiste en no tener relaciones sexuales o usar un mtodo de barrera (condn, [place], capuchn cervical) en los [employer] la mujer podra quedar [place].   Mtodo calendario.  Consiste en el seguimiento de la duracin de cada ciclo menstrual y la identificacin de los perodos frtiles.   Mtodo de Occupational hygienist.  Consiste en evitar las relaciones sexuales durante la ovulacin.   Mtodo sintotrmico. Paramedic las relaciones sexuales en la poca en la que se est ovulando, utilizando un  termmetro y tendiendo en cuenta los sntomas de la ovulacin.   Mtodo post-ovulacin. Consiste en planificar las relaciones sexuales para despus de haber ovulado.  Independientemente del tipo o mtodo anticonceptivo que usted elija, es importante que use condones para protegerse contra las enfermedades de transmisin sexual (ETS). Hable con su mdico con respecto a qu mtodo anticonceptivo es el ms apropiado para usted.  Document Released: 08/13/2005 Document Revised: 08/02/2011 [facility] Patient Information 2012 [place], [place].Amamantar al beb (Breastfeeding) LOS BENEFICIOS DE AMAMANTAR Para el beb  La primera leche (calostro ) ayuda al mejor funcionamiento del sistema digestivo del beb.   La leche tiene anticuerpos que provienen de la madre y que ayudan a prevenir las infecciones en el beb.   Hay una menor incidencia de asma, enfermedades alrgicas y SMSI (sndrome de muerte sbita nfantil).   Los nutrientes que contiene la [place] materna son mejores que las frmulas para el bibern y favorecen el desarrollo cerebral.   Los bebs amamantados sufren menos gases, clicos y constipacin.  Para la mam  La lactancia materna favorece el desarrollo de un vnculo muy especial entre la madre y el beb.   Es ms conveniente, siempre disponible a la Optician, dispensing y ms econmica que la [employer]   Consume caloras en la madre y la ayuda a perder el peso ganado durante el [place].   Favorece la contraccin del tero a su tamao normal, de manera ms rpida y [employer] las hemorragias luego del [place].   Las [employer]  amamantan tienen menor riesgo de Geophysical data processor de mama.  AMAMNTELO CON FRECUENCIA  Un beb sano, nacido a trmino, puede amamantarse con tanta frecuencia como cada hora, o espaciar las comidas cada tres horas.   Esta frecuencia variar de un beb a otro. Observe al beb cuando manifieste signos de hambre, antes que regirse por el reloj.    Amamntelo tan seguido como el beb lo solicite, o cuando usted sienta la necesidad de Paramedic sus [place].   Despierte al beb si han pasado 3  4 horas desde la ltima comida.   El amamantamiento frecuente la ayudar a producir ms [place] y a Education officer, community de Engineer, mining en los pezones e hinchazn de las [place].  LA POSICIN DEL BEB PARA AMAMANTARLO  Ya sea que se encuentre acostada o sentada, asegrese que el abdomen del beb enfrente el suyo.   Sostenga la mama con el pulgar por arriba y el resto de los dedos por debajo. Asegrese que sus dedos se encuentren lejos del pezn y de la boca del beb.   Toque suavemente los labios del beb y la mejilla ms cercana a la mama con el dedo o el pezn.   Cuando la boca del beb se abra lo suficiente, introduzca el pezn y la zona oscura que lo rodea tanto como le sea posible dentro de la boca.   Coloque a beb cerca suyo de modo que su nariz y mejillas toquen las mamas al [employer].  LAS COMIDAS  La duracin de cada comida vara de un beb a otro y de [place] comida a [place].   El beb debe succionar alrededor [employer] o tres minutos para que le llegue [place]. Esto se denomina "bajada". Por este motivo, permita que el nio se alimente en cada mama todo lo que desee. Terminar de mamar cuando haya recibido la cantidad [place] de nutrientes.   Para detener la succin coloque su dedo en la comisura de la boca del nio y Midwife entre sus encas antes de quitarle la mama de la boca. Esto la ayudar a English as a second language teacher.  REDUCIR LA CONGESTIN DE LAS MAMAS  Durante la primera semana despus del parto, usted puede experimentar [employer]. Cuando las mamas estn congestionadas, se sienten calientes, llenas y molestas al tacto. Puede reducir la congestin si:   Lo amamanta frecuentemente, cada 2-3 horas. Las mams que [employer] pronto y con frecuencia tienen menos problemas de [place].   Coloque bolsas fras livianas entre cada  [place]. Esto ayuda a Building services engineer. Envuelva las bolsas de hielo en una toalla liviana para proteger su piel.   Aplique compresas hmedas calientes [employer] durante 5 a 10 minutos antes de amamantar al [employer]. Esto aumenta la circulacin y [place] a que la [place].   Masajee suavemente la mama antes y Psychologist, sport and exercise.   Asegrese que el nio vaca al menos una mama antes de cambiar de lado.   Use un sacaleche para vaciar la mama si el beb se duerme o no se alimenta bien. Tambin podr [employer] con esta bomba si tiene que volver al trabajo o siente que las mamas estn congestionadas.   Evite los biberones, chupetes o complementar la alimentacin con agua o jugos en lugar de la [place].   Verifique que el beb se encuentra en la posicin correcta mientras lo alimenta.   Evite el cansancio, el estrs y la anemia   Use un soutien que sostenga bien sus  mamas y [employer] tienen aro.   Consuma una dieta balanceada y beba lquidos en cantidad.  Si sigue estas indicaciones, la congestin debe mejorar en 24 a 48 horas. Si an tiene dificultades, consulte a Barista. TENDR SUFICIENTE LECHE MI BEB? Algunas veces las madres se preocupan acerca de si sus bebs tendrn la leche suficiente. Puede asegurarse que el beb tiene la leche suficiente si:  El beb succiona y escucha que traga activamente.   El nio se alimenta al menos 8 a 12 veces en 24 horas. Alimntelo hasta que se desprenda por sus propios medios o se quede dormido en la primera mama (al menos durante 10 a 20 minutos), luego ofrzcale el otro lado.   El beb moja 5 a 6 paales descartables (6 a 8 paales de tela) en 24 horas cuando tiene 5  6 das de vida.   Tiene al menos 2-3 deposiciones todos los [employer] primeros meses. La leche materna es todo el alimento que el beb necesita. No es necesario que el nio ingiera agua o preparados de bibern. De hecho, para ayudar a que sus  mamas produzcan ms [place], lo mejor es no darle al beb suplementos durante las primeras semanas.   La materia fecal debe ser blanda y [place].   El beb debe aumentar 112 a 196 g por semana.  CUDESE Cuide sus mamas del siguiente modo:  Bese o dchese diariamente.   No lave sus pezones con jabn.   Comience a amamantar del lado izquierdo en una comida y del lado derecho en la siguiente.   Notar que [employer] suministro de [place] a los 2 a 5 [address]  [address] despus del [place]. Puede sentir algunas molestias por la congestin, lo que hace que sus mamas estn duras y sensibles. La congestin disminuye en 24 a 48 horas. Mientras tanto, aplique toallas hmedas calientes durante 5 a 10 minutos antes de amamantar. Un masaje suave y la extraccin de un poco de leche antes de Museum/gallery exhibitions officer ablandarn las mamas y har ms fcil que el beb se agarre. Use un buen sostn y seque al aire los pezones durante 10 a 15 minutos luego de cada alimentacin.   Solo utilice apsitos de algodn.   Utilice lanolina [employer] pezones luego de [place]. No necesita lavarlos luego de alimentar al [employer].  Cudese del siguiente modo:   Consuma alimentos bien balanceados y refrigerios nutritivos.   Dixie Dials, jugos de fruta y agua para Warehouse manager sed (alrededor de 8 vasos por Futures trader).   Descanse lo suficiente.   Aumente la ingesta de calcio en la dieta (1200mg /da).   Evite los alimentos que usted nota que puedan afectar al beb.  SOLICITE ATENCIN MDICA SI:  Tiene preguntas que formular o dificultades con la alimentacin a pecho.   Necesita ayuda.   Observa una zona dura, roja y que le duele en la zona de la mama, y se acompaa de fiebre de 100.5 F (38.1 C) o ms.   El beb est muy somnoliento como para alimentarse bien o tiene problemas para dormir.   El beb moja menos de 6 paales por da, a partir de los [address] de [place].   La piel del beb o la parte blanca de sus ojos est ms amarilla de lo que  estaba en el hospital.   Se siente deprimida.  Document Released: 08/13/2005 Document Revised: 08/02/2011 [facility] Patient Information 2012 [place], [place].

## 2012-05-05 NOTE — Progress Notes (Signed)
Pulse: 73

## 2012-05-05 NOTE — Progress Notes (Signed)
Diabetes Education:  Provided one box strips Lot: [id number] Exp: 2104/07/16.  Maggie Brentley Horrell, RN, CDE

## 2012-05-05 NOTE — Progress Notes (Signed)
Fastings 75-92; All post prandials are <120.  Continue glyburide.  Has fetal echo next week.

## 2012-05-26 ENCOUNTER — Ambulatory Visit (INDEPENDENT_AMBULATORY_CARE_PROVIDER_SITE_OTHER): Payer: Self-pay | Admitting: Family Medicine

## 2012-05-26 VITALS — BP 108/61 | Temp 98.9°F | Wt 202.0 lb

## 2012-05-26 DIAGNOSIS — O0992 Supervision of high risk pregnancy, unspecified, second trimester: Secondary | ICD-10-CM

## 2012-05-26 DIAGNOSIS — O34219 Maternal care for unspecified type scar from previous cesarean delivery: Secondary | ICD-10-CM

## 2012-05-26 DIAGNOSIS — O24919 Unspecified diabetes mellitus in pregnancy, unspecified trimester: Secondary | ICD-10-CM

## 2012-05-26 LAB — POCT URINALYSIS DIP (DEVICE)
Hgb urine dipstick: NEGATIVE
Ketones, ur: NEGATIVE mg/dL
Protein, ur: NEGATIVE mg/dL
Specific Gravity, Urine: 1.015 (ref 1.005–1.030)
Urobilinogen, UA: 0.2 mg/dL (ref 0.0–1.0)

## 2012-05-26 NOTE — Patient Instructions (Addendum)
Toma 1/2 tableta Glyburide  (1.25 mg) con la cena. Si sigue con azucar baja en la madrugada, deje de tomar el Glyburide. Sigue midiendo Forensic psychologist y  2 horas despues de cada comida. Trata de comer una entre-comidas en la noche con mas proteina (mantequilla de [place], [place].   Embarazo - Segundo trimestre (Pregnancy - Second Trimester) El segundo trimestre del Psychiatrist (del 3 al ) es un perodo de evolucin rpida para usted y el beb. Hacia el final del sexto mes, el beb mide aproximadamente 23 cm y pesa 680 g. Comenzar a Pharmacologist del beb [place] 18 y las 20 [address] de [place]. Podr sentir las pataditas ("quickening en ingls"). Hay un rpido [employer]. Puede segregar un lquido claro Charity fundraiser) de las [place]. Quizs sienta pequeas contracciones en el vientre (tero) Esto se conoce como falso trabajo de parto o contracciones de Braxton-Hicks. Es como una prctica del trabajo de parto que se produce cuando el beb est listo para salir. Generalmente los problemas de vmitos matinales ya se han superado hacia el final del Medical sales representative. Algunas mujeres desarrollan pequeas manchas oscuras (que se denominan cloasma, mscara del embarazo) en la cara que normalmente se van luego del nacimiento del beb. La exposicin al sol empeora las manchas. Puede desarrollarse acn en algunas mujeres embarazadas, y puede desaparecer en aquellas que ya tienen acn. EXAMENES PRENATALES  Durante los [employer], deber seguir realizando pruebas de [place], segn avance el [place]. Estas pruebas se realizan para controlar su salud y la del beb. Tambin se realizan anlisis de sangre para [employer] niveles de [place]. La anemia (bajo nivel de hemoglobina) es frecuente durante el embarazo. Para prevenirla, se administran hierro y vitaminas. Tambin se le realizarn exmenes para saber si tiene diabetes entre las 24 y las 28 semanas del [place]. Podrn repetirle algunas de  las [employer] Enterprises hicieron previamente.   En cada visita le medirn el tamao del tero. Esto se realiza para asegurarse de que el beb est creciendo correctamente de acuerdo al estado del [place].   Tambin en cada visita prenatal controlarn su presin arterial. Esto se realiza para asegurarse de que no tenga toxemia.   Se controlar su orina para asegurarse de que no tenga infecciones, diabetes o protena en la orina.   Se controlar su peso regularmente para asegurarse que el aumento ocurre al ritmo indicado. Esto se hace para asegurarse que usted y el beb tienen una evolucin normal.   En algunas ocasiones se realiza una prueba de ultrasonido para confirmar el correcto desarrollo y evolucin del beb. Esta prueba se realiza con ondas sonoras inofensivas para el beb, de modo que el profesional pueda calcular ms precisamente la fecha del [place].  Algunas veces se realizan pruebas especializadas del lquido amnitico que rodea al beb. Esta prueba se denomina amniocentesis. El lquido amnitico se obtiene introduciendo una aguja en el vientre (abdomen). Se realiza para Conservator, museum/gallery en los que existe alguna preocupacin acerca de algn problema gentico que pueda sufrir el beb. En ocasiones se lleva a cabo cerca del final del embarazo, si es necesario inducir al [employer]. En este caso se realiza para asegurarse que los pulmones del beb estn lo suficientemente maduros como para que pueda vivir fuera del tero. CAMBIOS QUE OCURREN EN EL SEGUNDO TRIMESTRE DEL EMBARAZO Su organismo atravesar numerosos cambios durante el [employer]. Estos pueden variar de Neomia Dear persona a otra. Converse con el profesional que la asiste acerca  los cambios que usted note y que la preocupen.  Durante el segundo trimestre probablemente sienta un aumento del apetito. Es normal tener "antojos" de Development worker, community. Esto vara de Neomia Dear persona a otra y de un embarazo a Therapist, art.   El abdomen inferior  comenzar a abultarse.   Podr tener la necesidad de Geographical information systems officer con ms frecuencia debido a que el tero y el beb presionan sobre la vejiga. Tambin es frecuente contraer ms infecciones urinarias durante el embarazo (dolor al ConocoPhillips). Puede evitarlas bebiendo gran cantidad de lquidos y vaciando la vejiga antes y despus de Sales promotion account executive.   Podrn aparecer las primeras estras en las caderas, abdomen y [place]. Estos son cambios normales del cuerpo durante el [place]. No existen medicamentos ni ejercicios que puedan prevenir [employer].   Es posible que comience a desarrollar venas inflamadas y abultadas (varices) en las piernas. El uso de medias de descanso, Optometrist sus pies durante 15 minutos, 3 a 4 veces al da y Film/video editor la sal en su dieta ayuda a Journalist, newspaper.   Podr sentir Engineering geologist gstrica a medida que el tero crece y Doctor, general practice. Puede tomar anticidos, con la autorizacin de su mdico, para Financial planner. Tambin es til ingerir pequeas comidas 4 a 5 veces al Futures trader.   La constipacin puede tratarse con un laxante o agregando fibra a su dieta. Beber grandes cantidades de lquidos, comer vegetales, frutas y granos integrales es de [place].   Tambin es beneficioso practicar actividad fsica. Si ha sido una persona Engineer, mining, podr continuar con la Harley[employer]son de las actividades durante el mismo. Si ha sido [employer], puede ser beneficioso que comience con un programa de ejercicios, Museum/gallery exhibitions officer.   Puede desarrollar hemorroides (vrices en el recto) hacia el final del segundo trimestre. Tomar baos de asiento tibios y Chemical engineer cremas recomendadas por el profesional que lo asiste sern de ayuda para los problemas de hemorroides.   Tambin podr Financial risk analyst de espalda durante este momento de su embarazo. Evite levantar objetos pesados, utilice zapatos de taco bajo y [place] buena postura para ayudar a reducir los  problemas de [place].   Algunas mujeres embarazadas desarrollan hormigueo y adormecimiento de la mano y los dedos debido a la hinchazn y compresin de los ligamentos de la mueca (sndrome del tnel carpiano). Esto desaparece una vez que el beb nace.   Como sus pechos se agrandan, Pension scheme manager un sujetador ms grande. Use un sostn de soporte, cmodo y de algodn. No utilice un sostn para amamantar hasta el ltimo mes de embarazo si va a amamantar al beb.   Podr observar una lnea oscura desde el ombligo hacia la zona pbica denominada linea nigra.   Podr observar que sus mejillas se ponen coloradas debido al aumento de flujo sanguneo en la cara.   Podr desarrollar "araitas" en la cara, cuello y pecho. Esto desaparece una vez que el beb nace.  INSTRUCCIONES PARA EL CUIDADO DOMICILIARIO  Es extremadamente importante que evite el cigarrillo, hierbas medicinales, alcohol y las drogas no prescriptas durante el Psychiatrist. Estas sustancias qumicas afectan la formacin y el desarrollo del beb. Evite estas sustancias durante todo el embarazo para asegurar el nacimiento de un beb sano.   La mayor parte de los cuidados que se aconsejan son los mismos que los indicados para Financial risk analyst trimestre del Psychiatrist. Cumpla con las citas tal como se le indic. Siga las instrucciones del profesional que lo asiste con Camera operator  al [employer], el ejercicio y la dieta.   Durante el embarazo debe obtener nutrientes para usted y para su beb. Consuma alimentos balanceados a intervalos regulares. Elija alimentos como carne, pescado, [place] y otros productos lcteos descremados, vegetales, frutas, panes integrales y cereales. El Equities trader cul es el aumento de peso ideal.   Las relaciones sexuales fsicas pueden continuarse hasta cerca del fin del embarazo si no existen otros problemas. Estos problemas pueden ser la prdida temprana (prematura) de lquido amnitico de las [place],  sangrado vaginal, dolor abdominal u otros problemas mdicos o del Psychiatrist.   Realice [employer] Corporation, si no tiene restricciones. Consulte con el profesional que la asiste si no sabe con certeza si determinados ejercicios son seguros. El mayor aumento de peso tiene Environmental consultant durante los ltimos 2 trimestres del Psychiatrist. El ejercicio la ayudar a:   Engineering geologist.   Ponerla en forma para el parto.   Ayudarla a perder peso luego de haber dado a luz.   Use un buen sostn o como los que se usan para hacer deportes para Paramedic la sensibilidad de las [place]. Tambin puede serle til si lo [place] mientras duerme. Si pierde Product manager, podr [employer]in.   No utilice la baera con agua caliente, baos turcos y saunas durante el [address]   Utilice el cinturn de seguridad sin excepcin cuando conduzca. Este la proteger a usted y al beb en caso de accidente.   Evite comer carne cruda, queso crudo, y el contacto con los utensilios y desperdicios de los gatos. Estos elementos contienen grmenes que pueden causar defectos de nacimiento en el beb.   El segundo trimestre es un buen momento para visitar a su dentista y Software engineer si an no lo ha hecho. Es Primary school teacher los dientes limpios. Utilice un cepillo de dientes blando. Cepllese ms suavemente durante el embarazo.   Es ms fcil perder algo de orina durante el [place]. Apretar y Chief Operating Officer los msculos de la pelvis la ayudar con este problema. Practique detener la miccin cuando est en el bao. Estos son los mismos msculos que Development worker, international aid. Son [employer] mismos msculos que utiliza cuando trata de [employer]der System gases. Puede practicar apretando estos msculos 10 veces, y repetir esto 3 veces por da aproximadamente. Una vez que conozca qu msculos debe apretar, no realice estos ejercicios durante la miccin. Puede favorecerle una infeccin si la orina vuelve hacia atrs.   Pida ayuda si  tiene necesidades econmicas, de asesoramiento o nutricionales durante el [place]. El profesional podr ayudarla con respecto a estas necesidades, o derivarla a otros especialistas.   La piel puede ponerse grasa. Si esto sucede, lvese la cara con un jabn [place], utilice un humectante no graso y [place] con base de aceite o crema.  CONSUMO DE MEDICAMENTOS Y DROGAS DURANTE EL EMBARAZO  Contine tomando las vitaminas apropiadas para esta etapa tal como se le indic. Las vitaminas deben contener un miligramo de cido flico y deben suplementarse con hierro. Guarde todas las vitaminas fuera del alcance de los nios. La ingestin de slo un par de vitaminas o tabletas que contengan hierro puede ocasionar la [employer] en un beb o en un nio pequeo.   Evite el uso de [place], inclusive los de venta libre y hierbas que no hayan sido prescriptos o indicados por el profesional que la asiste. Algunos medicamentos pueden causar problemas fsicos al beb. Utilice los medicamentos de venta [place] o de  prescripcin para el dolor, el malestar o la fiebre, segn se lo indique el profesional que lo asiste. No utilice aspirina.   El consumo de alcohol est relacionado con ciertos defectos de nacimiento. Esto incluye el sndrome de alcoholismo fetal. Debe evitar el consumo de alcohol en cualquiera de sus formas. El cigarrillo causa nacimientos prematuros y bebs de Sc[place]. El uso de drogas recreativas est absolutamente prohibido. Son muy nocivas para el beb. Un beb que nace de [employer], ser adicto al nacer. Ese beb tendr los mismos sntomas de abstinencia que un adulto.   Infrmele al profesional si consume alguna droga.   No consuma drogas ilegales. Pueden causarle mucho dao al beb.  SOLICITE ATENCIN MDICA SI: Tiene preguntas o preocupaciones durante su embarazo. Es mejor que llame para Science writer las dudas que esperar hasta su prxima visita prenatal. Thressa Sheller forma se sentir ms tranquila.    SOLICITE ATENCIN MDICA DE INMEDIATO SI:  La temperatura oral se eleva sin motivo por encima de 102 F (38.9 C) o segn le indique el profesional que lo asiste.   Tiene una prdida de lquido por la vagina (canal de parto). Si sospecha una ruptura de las [place], tmese la temperatura y llame al profesional para informarlo sobre esto.   Observa unas pequeas manchas, una hemorragia vaginal o elimina cogulos. Notifique al profesional acerca de la cantidad y de cuntos apsitos est utilizando. Unas pequeas manchas de sangre son algo comn durante el Psychiatrist, especialmente despus de Sales promotion account executive.   Presenta un olor desagradable en la secrecin vaginal y observa un cambio en el color, de transparente a blanco.   Contina con las nuseas y no obtiene alivio de los remedios indicados. Vomita sangre o algo similar a la borra del caf.   [place] o sube ms de 900 g. en una semana, o segn lo indicado por el profesional que la asiste.   Observa que se le Southwest Airlines, las manos, los pies o las piernas.   Ha estado expuesta a la rubola y no ha sufrido la enfermedad.   Ha estado expuesta a la quinta enfermedad o a la varicela.   Presenta dolor abdominal. Las molestias en el ligamento redondo son Neomia Dear causa no cancerosa (benigna) frecuente de dolor abdominal durante el embarazo. El profesional que la asiste deber evaluarla.   Presenta dolor de cabeza intenso que no se [place].   Presenta fiebre, diarrea, dolor al orinar o le falta la respiracin.   Presenta dificultad para ver, visin borrosa, o visin doble.   Sufre una cada, un accidente de trnsito o cualquier tipo de trauma.   Vive en un hogar en el que existe violencia fsica o mental.  Document Released: 05/23/2005 Document Revised: 08/02/2011 [facility] Patient Information 2012 [place], [place].  Diabetes mellitus gestacional (Gestational Diabetes Mellitus) La diabetes mellitus gestacional se produce slo  durante el embarazo. Aparece cuando el organismo no puede controlar adecuadamente la glucosa (azcar) que aumenta en la sangre despus de comer. Durante el [place], se produce una resistencia a la insulina (sensibilidad reducida a la insulina) debido a la liberacin de hormonas por parte de la placenta. Generalmente, el pncreas de una mujer embarazada produce la cantidad suficiente de insulina para vencer esa resistencia. Sin embargo, en la diabetes gestacional, hay insulina pero no cumple su funcin adecuadamente. Si la resistencia es lo suficientemente grave como para que el pncreas no produzca la cantidad de insulina suficiente, la glucosa extra se acumula en la sangre.  Rozann Lesches  TIENEN RIESGO DE DESARROLLAR DIABETES GESTACIONAL?  Las mujeres con historia de diabetes en la familia.   Las mujeres de ms de [address]   Las que presentan sobrepeso.   Las [employer] pertenecen a ciertos grupos tnicos (latinas, afroamericanas, norteamericanas nativas, asiticas y las originarias de las islas del Pacfico.  QUE PUEDE OCURRIRLE AL BEB? Si el nivel de glucosa en sangre de la madre es demasiado elevado mientras este [place], el nivel extra de azcar pasar por el cordn umbilical hacia el beb. Algunos de los problemas del beb pueden ser:  Beb demasiado grande: si el nio recibe Chief Strategy Officer, puede aumentar mucho de [place]. Esto puede hacer que sea demasiado grande para nacer por parto normal (vaginal) por lo que ser necesario realizar una cesrea.   Bajo nivel de glucosa (hipoglucemia): el beb produce insulina extra en respuesta a la excesiva cantidad de azcar que obtiene de D[employer] Energy Company. Cuando el beb nace y ya no necesita insulina extra, su nivel de azcar en sangre puede disminuir.   Ictericia (coloracin amarillenta de la piel y los ojos): esto es bastante frecuente en los bebs. La causa es la acumulacin de una sustancia qumica denominada bilirrubina. No siempre es un trastorno grave,  pero se observa con frecuencia en los bebs cuyas madres sufren diabetes gestacional.  RIESGOS PARA LA MADRE Las mujeres que han sufrido diabetes gestacional pueden tener ms riesgos para algunos problemas como:  Preeclampsia o toxemia, incluyendo problemas con hipertensin arterial. La presin arterial y los niveles de protenas en la orina deben controlarse con frecuencia.   Infecciones   Parto por cesrea.   Aparicin de diabetes tipo 2 en una etapa posterior de la vida. Alrededor del 30% al 50% sufrir diabetes posteriormente, especialmente las que son obesas.  DIAGNSTICO Las hormonas que causan resistencia a la insulina tienen su mayor nivel alrededor de las 24 a 28 semanas del Psychiatrist. Si se experimentan sntomas, stos son similares a los sntomas que normalmente aparecen durante el embarazo.  La diabetes mellitus gestacional generalmente se diagnostica por medio de un mtodo en dos partes: 1. Despus de la 24 a 28 semanas de Psychiatrist, la mujer debe beber una solucin que contiene glucosa y Education officer, environmental un anlisis de [place]. Si el nivel de glucosa es elevado, la realizarn un segundo [place].  2. La prueba oral de tolerancia a la glucosa, que dura aproximadamente tres horas. Despus de realizar ayuno durante la noche, se controla nivel de glucosa en sangre. La mujer bebe una solucin que contiene glucosa y Chief Executive Officer realizan anlisis de glucosa en sangre cada hora.  Si la mujer tiene factores de riesgos para la diabetes mellitus gestacional, el mdico podr Programme researcher, broadcasting/film/video anlisis antes de las 24 semanas de [place]. TRATAMIENTO El tratamiento est dirigido a Insurance underwriter en sangre de la madre en un nivel normal y puede incluir:  La planificacin de los alimentos.   Recibir insulina u otro medicamento para Sales executive nivel de glucosa en [place].   La prctica de ejercicios.   Llevar un registro diario de los alimentos que consume.   Control y Engineer, maintenance (IT) de los niveles de glucosa en  [place].   Control de los niveles de cetona en la [place], [place] esto ya no se considera necesario en la mayora de los [place].  INSTRUCCIONES PARA EL CUIDADO DOMICILIARIO Mientras est embarazada:  Siga los consejos de su mdico relacionados con los controles prenatales, la planificacin de la comida, la actividad fsica, los medicamentos, vitaminas, los [place] de  sangre y [place] pruebas y las actividades fsicas.   Lleve un registro de las comidas, las pruebas de glucosa en sangre y la cantidad de insulina que recibe (si corresponde). Muestre todo al profesional en cada consulta mdica prenatal.   Si sufre diabetes mellitus gestacional, podr tener problemas de hipoglucemia (nivel bajo de glucosa en sangre). Podr sospechar este problema si se siente repentinamente mareada, tiene temblores y/o se siente dbil. Si cree que esto le est ocurriendo, y tiene un medidor de glucosa, mida su nivel de Event organiser. Siga los consejos de su mdico sobre el modo y el momento de tratar su nivel de glucosa en sangre. Generalmente se sigue la regla 15:15 Consuma 15 g de hidratos de carbono, espere 15 minutos y Programmer, systems el nivel de glucosa en [place].Barbara Cower de 15 g de hidratos de carbono son:   1 taza de [employer].    taza de jugo.   3-4 tabletas de glucosa.   5-6 caramelos duros.   1 caja pequea de pasas de uva.    taza de gaseosa comn.   Mantenga una buena higiene para evitar infecciones.   No fume.  SOLICITE ATENCIN MDICA SI:  Observa prdida vaginal con o sin picazn.   Se siente ms dbil o cansada que lo habitual.   Primus [place].   Tiene un aumento de peso repentino, 2,5 kg o ms en una semana.   Pierde peso, 1.5 kg o ms en una semana.   Su nivel de glucosa en sangre es elevado, necesita instrucciones.  SOLICITE ATENCIN MDICA DE INMEDIATO SI:  Sufre una cefalea intensa.   Se marea o pierde el conocimiento   Presenta nuseas o vmitos.   Se  siente desorientada confundida.   Sufre convulsiones.   Tiene problemas de visin.   Siente Physiological scientist.   Presenta una hemorragia vaginal abundante.   Tiene contracciones uterinas.   Tiene una prdida importante de lquido por la vagina  DESPUS QUE NACE EL BEB:  Concurra a todos los controles de seguimiento y Clinical biochemist los anlisis de sangre segn las indicaciones de su mdico.   Mantenga un estilo de vida saludable para evitar la diabetes en el futuro. Aqu se incluye:   Siga el plan de alimentacin saludable.   Controle su peso.   Practique actividad fsica y descanse lo necesario.   No fume.   Amamante a su beb mientras pueda. Esto disminuir la probabilidad de que usted y su beb sufran diabetes posteriormente.  Para ms informacin acerca de la diabetes, visite la pgina web de Holiday representative Diabetes Association: [email or web address].com.cy. Para ms informacin acerca de la diabetes gestacional cite la pgina web del [employer] of Obstetricians and Gynecologists en: RentRule.com.au. Document Released: 05/23/2005 Document Revised: 08/02/2011 [facility] Patient Information 2012 [place], [place].

## 2012-05-26 NOTE — Progress Notes (Signed)
Fasting sugars 72-98. Only one or two over 95. PP 58-126, only 2 values above normal. Frequently wakes at 3 or 4 AM feeling dizzy/shaky, sugars 45-50. Snack at bedtime - fruit or cereal and milk. Taking 2.5 mg Glyburide at bed time. Has changed diet. Will cut back on glyburide - 1.25 mg (1/2 tablet) at dinner time. Discussed higher protein vs carb bedtime snack. Will titrate glyburide based on early AM sugars and fasting sugars (up to 2.5 or stop if diet controlled). Pt to call if any lows or highs. 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 Will order repeat sono.

## 2012-05-26 NOTE — Progress Notes (Signed)
[id number] 

## 2012-06-03 ENCOUNTER — Telehealth: Payer: Self-pay | Admitting: *Deleted

## 2012-06-03 NOTE — Telephone Encounter (Addendum)
Called pt w/Pacific interpreter # [phone] Sherilyn Cooter) regarding need for [place] appt. 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  Pt will need to be contacted at a later date and [place] needs to be scheduled per her convenience. Next clinic appt is 10/21 @ 1030. 10/11  1115  I called pt w/[employer] interpreter # [phone] and informed her of the need for follow up [place] appt.  Pt agreed and appt was scheduled on 06/09/12 @ 1015.  Pt voiced understanding.

## 2012-06-09 ENCOUNTER — Ambulatory Visit (HOSPITAL_COMMUNITY)
Admission: RE | Admit: 2012-06-09 | Discharge: 2012-06-09 | Disposition: A | Discharge status: Home / Self Care | Payer: Self-pay | Source: Ambulatory Visit | Attending: Family Medicine | Admitting: Family Medicine

## 2012-06-09 DIAGNOSIS — O34219 Maternal care for unspecified type scar from previous cesarean delivery: Secondary | ICD-10-CM | POA: Insufficient documentation

## 2012-06-09 DIAGNOSIS — O0992 Supervision of high risk pregnancy, unspecified, second trimester: Secondary | ICD-10-CM

## 2012-06-09 DIAGNOSIS — O99810 Abnormal glucose complicating pregnancy: Secondary | ICD-10-CM | POA: Insufficient documentation

## 2012-06-09 DIAGNOSIS — E669 Obesity, unspecified: Secondary | ICD-10-CM | POA: Insufficient documentation

## 2012-06-09 DIAGNOSIS — O99210 Obesity complicating pregnancy, unspecified trimester: Secondary | ICD-10-CM | POA: Insufficient documentation

## 2012-06-16 ENCOUNTER — Ambulatory Visit (INDEPENDENT_AMBULATORY_CARE_PROVIDER_SITE_OTHER): Payer: Self-pay | Admitting: Obstetrics & Gynecology

## 2012-06-16 VITALS — BP 111/66 | Temp 98.0°F | Wt 206.1 lb

## 2012-06-16 DIAGNOSIS — O0990 Supervision of high risk pregnancy, unspecified, unspecified trimester: Secondary | ICD-10-CM

## 2012-06-16 DIAGNOSIS — O24919 Unspecified diabetes mellitus in pregnancy, unspecified trimester: Secondary | ICD-10-CM

## 2012-06-16 LAB — POCT URINALYSIS DIP (DEVICE)
Glucose, UA: NEGATIVE mg/dL
Ketones, ur: NEGATIVE mg/dL
Specific Gravity, Urine: 1.02 (ref 1.005–1.030)
Urobilinogen, UA: 0.2 mg/dL (ref 0.0–1.0)

## 2012-06-16 NOTE — Progress Notes (Signed)
Pulse: 69

## 2012-06-16 NOTE — Patient Instructions (Signed)
Eleccin del mtodo anticonceptivo  (Contraception Choices) La anticoncepcin (control de la natalidad) es el uso de cualquier mtodo o dispositivo para evitar el embarazo. A continuacin se indican algunos de esos mtodos.  MTODOS HORMONALES   Implante anticonceptivo. Es un tubo plstico delgado que contiene la hormona progesterona. No contiene estrgenos. El mdico inserta el tubo en la parte interna del brazo. El tubo puede permanecer en el lugar durante 3 aos. Despus de los 3 aos debe retirarse. El implante impide que los ovarios liberen vulos (ovulacin), espesa el moco cervical, lo que evita que los espermatozoides ingresen al tero y hace ms delgada la membrana que cubre el interior del tero.  Inyecciones de progesterona sola. Estas inyecciones se administran cada 3 meses para evitar el embarazo. La progesterona sinttica impide que los ovarios liberen vulos. Tambin hace que el moco cervical se espese y modifica el recubrimiento interno del tero. Esto hace ms difcil que los espermatozoides sobrevivan en el tero.  Pldoras anticonceptivas. Las pldoras anticonceptivas contienen estrgenos y progesterona. Actan impidiendo que el vulo se forme en el ovario(ovulacin). Las pldoras anticonceptivas son recetadas por el mdico.Tambin se utilizan para tratar los perodos menstruales abundantes.  Minipldora. Este tipo de pldora anticonceptiva contiene slo hormona progesterona. Deben tomarse todos los das del mes y debe recetarlas el mdico.  Parches anticonceptivos. El parche contiene hormonas similares a las que contienen las pldoras anticonceptivas. Deben cambiarse una vez por semana y se utilizan bajo prescripcin mdica.  Anillo vaginal. Anillo vaginal contiene hormonas similares a las que contienen las pldoras anticonceptivas. Se deja colocado durante tres semanas, se lo retira durante 1 semana y luego se coloca uno nuevo. La paciente debe sentirse cmoda para insertar y  retirar el anillo de la vagina.Es necesaria la receta del mdico.  Anticonceptivos de emergencia. Los anticonceptivos de emergencia son mtodos para evitar un embarazo despus de una relacin sexual sin proteccin. Esta pldora puede tomarse inmediatamente despus de tener relaciones sexuales o hasta 5 das de haber tenido sexo sin proteccin. Es ms efectiva si se toma poco tiempo despus. Los anticonceptivos de emergencia estn disponibles sin prescripcin mdica. Consltelo con su farmacutico. No use los anticonceptivos de emergencia como nico mtodo anticonceptivo. MTODOS DE BARRERA   Condn masculino. Es una vaina delgada (ltex o goma) que se usa en el pene durante el acto sexual. Puede usarse con espermicida para aumentar la efectividad.  Condn femenino. Es una vaina blanda y floja que se adapta suavemente a la vagina antes de las relaciones sexuales.  Diafragma. Es una barrera de ltex redonda y suave que debe ser ajustada por un profesional. Se inserta en la vagina, junto con un gel espermicida. Debe insertarse antes de tener relaciones sexuales. Debe dejar el diafragma colocado en la vagina durante 6 a 8 horas despus de la relacin sexual.  Capuchn cervical. Es una taza de ltex o plstico, redonda y suave que cubre el cuello del tero y debe ser ajustada por un mdico. Puede dejarlo colocado en la vagina hasta 48 horas despus de las relaciones sexuales.  Esponja. Es una pieza blanda y circular de espuma de poliuretano. Contiene un espermicida. Se inserta en la vagina despus de mojarla y antes de las relaciones sexuales.  Espermicidas. Los espermicidas son qumicos que matan o bloquean el esperma y no lo dejan ingresar al cuello del tero y al tero. Vienen en forma de cremas, geles, supositorios, espuma o comprimidos. No es necesario tener receta mdica. Se insertan en la vagina con un aplicador   antes de tener relaciones sexuales. El proceso debe repetirse cada vez que tiene  relaciones sexuales. ANTICONCEPTIVOS INTRAUTERINOS   Dispositivo intrauterino (DIU). Es un dispositivo en forma de T que se coloca en el tero durante el perodo menstrual, para evitar el embarazo. Hay dos tipos:  DIU de cobre. Este tipo de DIU est recubierto con un alambre de cobre y se inserta dentro del tero. El cobre hace que el tero y las trompas de Falopio produzcan un liquido que destruye los espermatozoides. Puede permanecer colocado durante 10 aos.  DIU hormonal. Este tipo de DIU contiene la hormona progestina (progesterona sinttica). La hormona espesa el moco cervical y evita que los espermatozoides ingresen al tero y tambin afina la membrana que cubre el tero para evitar la implantacin del vulo fertilizado. La hormona debilita o destruye los espermatozoides que ingresan al tero. Puede permanecer colocado durante 5 aos. MTODOS ANTICONCEPTIVOS PERMANENTES   Ligadura de trompas en la mujer. La ligadura de trompas en la mujer se realiza sellando, atando u obstruyendo quirrgicamente las trompas de Falopio lo que impide que el vulo descienda hacia el tero.  Esterilizacin masculina. Se realiza atando los conductos por los que pasan los espermatozoides (vasectoma).Esto impide que el esperma ingrese a la vagina durante el acto sexual. Luego del procedimiento, el hombre puede eyacular lquido (semen). MTODOS DE PLANIFICACIN NATURAL   Planificacin familiar natural.  Consiste en no tener relaciones sexuales o usar un mtodo de barrera (condn, diafragma, capuchn cervical) en los das que la mujer podra quedar embarazada.  Mtodo calendario.  Consiste en el seguimiento de la duracin de cada ciclo menstrual y la identificacin de los perodos frtiles.  Mtodo de la ovulacin.  Consiste en evitar las relaciones sexuales durante la ovulacin.  Mtodo sintotrmico. Consiste en evitar las relaciones sexuales en la poca en la que se est ovulando, utilizando un termmetro y  tendiendo en cuenta los sntomas de la ovulacin.  Mtodo post-ovulacin. Consiste en planificar las relaciones sexuales para despus de haber ovulado. Independientemente del tipo o mtodo anticonceptivo que usted elija, es importante que use condones para protegerse contra las enfermedades de transmisin sexual (ETS). Hable con su mdico con respecto a qu mtodo anticonceptivo es el ms apropiado para usted.  Document Released: 08/13/2005 Document Revised: 11/05/2011 [employer] Patient Information 2013 [employer].  

## 2012-06-16 NOTE — Progress Notes (Signed)
Pt not on any meds--fasting 93,80,85,81,101,81,99,84,99,100;   All pp are <120.  Needs more lancets and strips.  BTL too expensive.  Does not like IUD.

## 2012-06-30 ENCOUNTER — Encounter: Payer: Self-pay | Attending: Obstetrics & Gynecology | Admitting: Dietician

## 2012-06-30 ENCOUNTER — Ambulatory Visit (INDEPENDENT_AMBULATORY_CARE_PROVIDER_SITE_OTHER): Payer: Self-pay | Admitting: Family Medicine

## 2012-06-30 VITALS — BP 102/59 | Temp 98.5°F | Wt 205.0 lb

## 2012-06-30 DIAGNOSIS — Z713 Dietary counseling and surveillance: Secondary | ICD-10-CM | POA: Insufficient documentation

## 2012-06-30 DIAGNOSIS — O24919 Unspecified diabetes mellitus in pregnancy, unspecified trimester: Secondary | ICD-10-CM

## 2012-06-30 DIAGNOSIS — O99810 Abnormal glucose complicating pregnancy: Secondary | ICD-10-CM | POA: Insufficient documentation

## 2012-06-30 DIAGNOSIS — Z23 Encounter for immunization: Secondary | ICD-10-CM

## 2012-06-30 LAB — POCT URINALYSIS DIP (DEVICE)
Bilirubin Urine: NEGATIVE
Glucose, UA: NEGATIVE mg/dL
Hgb urine dipstick: NEGATIVE
Nitrite: NEGATIVE
Specific Gravity, Urine: 1.025 (ref 1.005–1.030)

## 2012-06-30 MED ORDER — TETANUS-DIPHTH-ACELL PERTUSSIS 5-2.5-18.5 LF-MCG/0.5 IM SUSP
0.5000 mL | Freq: Once | INTRAMUSCULAR | Status: AC
  Administered 2012-06-30: 0.5 mL via INTRAMUSCULAR

## 2012-06-30 MED ORDER — INFLUENZA VIRUS VACC SPLIT PF IM SUSP
0.5000 mL | Freq: Once | INTRAMUSCULAR | Status: AC
  Administered 2012-06-30: 0.5 mL via INTRAMUSCULAR

## 2012-06-30 NOTE — Progress Notes (Signed)
P = 70 Pt states that she has difficulty breathing when she is sitting down. Feels that the baby moves a certain way to impact her ability to breath normally.

## 2012-06-30 NOTE — Patient Instructions (Signed)
Diabetes mellitus gestacional (Gestational Diabetes Mellitus) La diabetes mellitus gestacional se produce slo durante el embarazo. Aparece cuando el organismo no puede controlar adecuadamente la glucosa (azcar) que aumenta en la sangre despus de comer. Durante el embarazo, se produce una resistencia a la insulina (sensibilidad reducida a la insulina) debido a la liberacin de hormonas por parte de la placenta. Generalmente, el pncreas de una mujer embarazada produce la cantidad suficiente de insulina para vencer esa resistencia. Sin embargo, en la diabetes gestacional, hay insulina pero no cumple su funcin adecuadamente. Si la resistencia es lo suficientemente grave como para que el pncreas no produzca la cantidad de insulina suficiente, la glucosa extra se acumula en la sangre.  QUINES TIENEN RIESGO DE DESARROLLAR DIABETES GESTACIONAL?  Las mujeres con historia de diabetes en la familia.  Las mujeres de ms de 25 aos.  Las que presentan sobrepeso.  Las mujeres que pertenecen a ciertos grupos tnicos (latinas, afroamericanas, norteamericanas nativas, asiticas y las originarias de las islas del [employer]. QUE PUEDE OCURRIRLE AL BEB? Si el nivel de glucosa en sangre de la madre es demasiado elevado mientras este embarazada, el nivel extra de azcar pasar por el cordn umbilical hacia el beb. Algunos de los problemas del beb pueden ser:  Beb demasiado grande: si el nio recibe demasiada azcar, puede aumentar mucho de peso. Esto puede hacer que sea demasiado grande para nacer por parto normal (vaginal) por lo que ser necesario realizar una cesrea.  Bajo nivel de glucosa (hipoglucemia): el beb produce insulina extra en respuesta a la excesiva cantidad de azcar que obtiene de la madre. Cuando el beb nace y ya no necesita insulina extra, su nivel de azcar en sangre puede disminuir.  Ictericia (coloracin amarillenta de la piel y los ojos): esto es bastante frecuente en los bebs. La  causa es la acumulacin de una sustancia qumica denominada bilirrubina. No siempre es un trastorno grave, pero se observa con frecuencia en los bebs cuyas madres sufren diabetes gestacional. RIESGOS PARA LA MADRE Las mujeres que han sufrido diabetes gestacional pueden tener ms riesgos para algunos problemas como:  Preeclampsia o toxemia, incluyendo problemas con hipertensin arterial. La presin arterial y los niveles de protenas en la orina deben controlarse con frecuencia.  Infecciones  Parto por cesrea.  Aparicin de diabetes tipo 2 en una etapa posterior de la vida. Alrededor del 30% al 50% sufrir diabetes posteriormente, especialmente las que son obesas. DIAGNSTICO Las hormonas que causan resistencia a la insulina tienen su mayor nivel alrededor de las 24 a 28 semanas del embarazo. Si se experimentan sntomas, stos son similares a los sntomas que normalmente aparecen durante el embarazo.  La diabetes mellitus gestacional generalmente se diagnostica por medio de un mtodo en dos partes: 1. Despus de la 24 a 28 semanas de embarazo, la mujer debe beber una solucin que contiene glucosa y realizar un anlisis de sangre. Si el nivel de glucosa es elevado, la realizarn un segundo anlisis. 2. La prueba oral de tolerancia a la glucosa, que dura aproximadamente tres horas. Despus de realizar ayuno durante la noche, se controla nivel de glucosa en sangre. La mujer bebe una solucin que contiene glucosa y le realizan anlisis de glucosa en sangre cada hora. Si la mujer tiene factores de riesgos para la diabetes mellitus gestacional, el mdico podr indicar el anlisis antes de las 24 semanas de embarazo. TRATAMIENTO El tratamiento est dirigido a mantener la glucosa en sangre de la madre en un nivel normal y puede incluir:  La   planificacin de los alimentos.  Recibir insulina u otro medicamento para controlar el nivel de glucosa en sangre.  La prctica de ejercicios.  Llevar un  registro diario de los alimentos que consume.  Control y registro de los niveles de glucosa en sangre.  Control de los niveles de cetona en la orina, aunque esto ya no se considera necesario en la mayora de los embarazos. INSTRUCCIONES PARA EL CUIDADO DOMICILIARIO Mientras est embarazada:  Siga los consejos de su mdico relacionados con los controles prenatales, la planificacin de la comida, la actividad fsica, los medicamentos, vitaminas, los anlisis de sangre y otras pruebas y las actividades fsicas.  Lleve un registro de las comidas, las pruebas de glucosa en sangre y la cantidad de insulina que recibe (si corresponde). Muestre todo al profesional en cada consulta mdica prenatal.  Si sufre diabetes mellitus gestacional, podr tener problemas de hipoglucemia (nivel bajo de glucosa en sangre). Podr sospechar este problema si se siente repentinamente mareada, tiene temblores y/o se siente dbil. Si cree que esto le est ocurriendo, y tiene un medidor de glucosa, mida su nivel de glucosa en sangre. Siga los consejos de su mdico sobre el modo y el momento de tratar su nivel de glucosa en sangre. Generalmente se sigue la regla 15:15 Consuma 15 g de hidratos de carbono, espere 15 minutos y vuelva controlar el nivel de glucosa en sangre.. Ejemplos de 15 g de hidratos de carbono son:  1 taza de leche descremada.   taza de jugo.  3-4 tabletas de glucosa.  5-6 caramelos duros.  1 caja pequea de pasas de uva.   taza de gaseosa comn.  Mantenga una buena higiene para evitar infecciones.  No fume. SOLICITE ATENCIN MDICA SI:  Observa prdida vaginal con o sin picazn.  Se siente ms dbil o cansada que lo habitual.  Transpira mucho.  Tiene un aumento de peso repentino, 2,5 kg o ms en una semana.  Pierde peso, 1.5 kg o ms en una semana.  Su nivel de glucosa en sangre es elevado, necesita instrucciones. SOLICITE ATENCIN MDICA DE INMEDIATO SI:  Sufre una cefalea  intensa.  Se marea o pierde el conocimiento  Presenta nuseas o vmitos.  Se siente desorientada confundida.  Sufre convulsiones.  Tiene problemas de visin.  Siente dolor en el estmago.  Presenta una hemorragia vaginal abundante.  Tiene contracciones uterinas.  Tiene una prdida importante de lquido por la vagina DESPUS QUE NACE EL BEB:  Concurra a todos los controles de seguimiento y realice los anlisis de sangre segn las indicaciones de su mdico.  Mantenga un estilo de vida saludable para evitar la diabetes en el futuro. Aqu se incluye:  Siga el plan de alimentacin saludable.  Controle su peso.  Practique actividad fsica y descanse lo necesario.  No fume.  Amamante a su beb mientras pueda. Esto disminuir la probabilidad de que usted y su beb sufran diabetes posteriormente. Para ms informacin acerca de la diabetes, visite la pgina web de la American Diabetes Association: www.americandiabetesassociation.org. Para ms informacin acerca de la diabetes gestacional cite la pgina web del American [employer] of [employer]ians [employer]nec[employer] en: www.acog.org. Document Released: 05/23/2005 Document Revised: 11/05/2011 [employer] Patient Information 2013 [employer].   Embarazo  Tercer trimestre  (Pregnancy - Third Trimester) El tercer trimestre del embarazo (los ltimos 3 meses) es el perodo en el cual tanto usted como su beb crecen con ms rapidez. El beb alcanza un largo de aproximadamente 50 cm. y pesa entre 2,700 y 4,500 kg.   El beb gana ms tejido graso y est listo para la vida fuera del cuerpo de la madre. Mientras estn en el interior, los bebs tienen perodos de sueo y vigilia, succionan el pulgar y tienen hipo. Quizs sienta pequeas contracciones del tero. Este es el falso trabajo de parto. Tambin se las conoce como contracciones de Braxton-Hicks . Es como una prctica del parto. Los problemas ms habituales de esta etapa del embarazo incluyen  mayor dificultad para respirar, hinchazn de las manos y los pies por retencin de lquidos y la necesidad de orinar con ms frecuencia debido a que el tero y el beb presionan sobre la vejiga.  EXAMENES PRENATALES   Durante los exmenes prenatales, deber seguir realizndose anlisis de sangre. Estas pruebas se realizan para controlar su salud y la del beb. Los anlisis de sangre se realizan para conocer los niveles de algunos compuestos de la sangre (hemoglobina). La anemia (bajo nivel de hemoglobina) es frecuente durante el embarazo. Para prevenirla, se administran hierro y vitaminas. Tambin le tomarn nuevas anlisis para descartar diabetes. Podrn repetirle algunas de las pruebas que le hicieron previamente.  En cada visita le medirn el tamao del tero. Esto permite asegurar que el beb se desarrolla adecuadamente, segn la fecha del embarazo.  Le controlarn la presin arterial en cada visita prenatal. Esto es para asegurarse de que no sufre toxemia.  Le harn un anlisis de orina en cada visita prenatal, para descartar infecciones, diabetes y la presencia de protenas.  Tambin en cada visita controlarn su peso. Esto se realiza para asegurarse que aumenta de peso al ritmo indicado y que usted y su beb evolucionan normalmente.  En algunas ocasiones se realiza una prueba de ultrasonido para confirmar el correcto desarrollo y evolucin del beb. Esta prueba se realiza con ondas sonoras inofensivas para el beb, de modo que el profesional pueda calcular ms precisamente la fecha del parto.  Analice con su mdico los analgsicos y la anestesia que recibir durante el trabajo de parto y el parto.  Comente la posibilidad de que necesite una cesrea y qu anestesia se recibir.  Informe a su mdico si sufre violencia familiar mental o fsica. A veces, se indica la prueba especializada sin estrs, la prueba de tolerancia a las contracciones y el perfil biofsico para asegurarse de que el  beb no tiene problemas. El estudio del lquido amnitico que rodea al beb se llama amniocentesis. El lquido amnitico se obtiene introduciendo una aguja en el vientre (abdomen ). En ocasiones se lleva a cabo cerca del final del embarazo, si es necesario inducir a un parto. En este caso se realiza para asegurarse que los pulmones del beb estn lo suficientemente maduros como para que pueda vivir fuera del tero. Si los pulmones no han madurado y es peligroso que el beb nazca, se administrar a la madre una inyeccin de cortisona , 1 a 2 das antes del parto. . Esto ayuda a que los pulmones del beb maduren y sea ms seguro su nacimiento.  CAMBIOS QUE OCURREN EN EL TERCER TRIMESTRE DEL EMBARAZO  Su organismo atravesar numerosos cambios durante el embarazo. Estos pueden variar de una persona a otra. Converse con el profesional que la asiste acerca los cambios que usted note y que la preocupen.   Durante el ltimo trimestre probablemente sienta un aumento del apetito. Es normal tener "antojos" de ciertas comidas. Esto vara de una persona a otra y de un embarazo a otro.  Podrn aparecer las primeras estras en las caderas, abdomen   y mamas. Estos son cambios normales del cuerpo durante el embarazo. No existen medicamentos ni ejercicios que puedan prevenir estos cambios.  La constipacin puede tratarse con un laxante o agregando fibra a su dieta. Beber grandes cantidades de lquidos, tomar fibras en forma de vegetales, frutas y granos integrales es de gran ayuda.  Tambin es beneficioso practicar actividad fsica. Si ha sido una persona activa hasta el embarazo, podr continuar con la mayora de las actividades durante el mismo. Si ha sido menos activa, puede ser beneficioso que comience con un programa de ejercicios, como realizar caminatas. Consulte con el profesional que la asiste antes de comenzar un programa de ejercicios.  Evite el consumo de cigarrillos, el alcohol, los medicamentos no recetados y  las "drogas de la calle" durante el embarazo. Estas sustancias qumicas afectan la formacin y el desarrollo del beb. Evite estas sustancias durante todo el embarazo para asegurar el nacimiento de un beb sano.  Podr sentir dolor de espalda, tener vrices en las venas y hemorroides, o si ya los sufra, pueden empeorar.  Durante el tercer trimestre se cansar con ms facilidad, lo cual es normal.  Los movimientos del beb pueden ser ms fuertes y con ms frecuencia.  Puede que note dificultades para respirar normalmente.  El ombligo puede salir hacia afuera.  A veces sale una secrecin amarilla de las mamas, que se llama calostro.  Podr aparecer una secrecin mucosa con sangre. Esto suele ocurrir entre unos pocos das y una semana antes del parto. INSTRUCCIONES PARA EL CUIDADO EN EL HOGAR   Cumpla con las citas de control. Siga las indicaciones del mdico con respecto al uso de medicamentos, los ejercicios y la dieta.  Durante el embarazo debe obtener nutrientes para usted y para su beb. Consuma alimentos balanceados a intervalos regulares. Elija alimentos como carne, pescado, leche y otros productos lcteos descremados, vegetales, frutas, panes integrales y cereales. El mdico le informar cul es el aumento de peso ideal.  Las relaciones sexuales pueden continuarse hasta casi el final del embarazo, si no se presentan otros problemas como prdida prematura (antes de tiempo) de lquido amnitico, hemorragia vaginal o dolor en el vientre (abdominal).  Realice actividad fsica todos los das, si no tiene restricciones. Consulte con el profesional que la asiste si no sabe con certeza si determinados ejercicios son seguros. El mayor aumento de peso se producir en los ltimos 2 trimestres del embarazo. El ejercicio ayuda a:  Controlar su peso.  Mantenerse en forma para el trabajo de parto y el parto .  Perder peso despus del parto.  Haga reposo con frecuencia, con las piernas elevadas,  o segn lo necesite para evitar los calambres y el dolor de cintura.  Use un buen sostn o como los que se usan para hacer deportes para aliviar la sensibilidad de las mamas. Tambin puede serle til si lo usa mientras duerme. Si pierde calostro, podr utilizar apsitos en el sostn.  No utilice la baera con agua caliente, baos turcos y saunas.  Colquese el cinturn de seguridad cuando conduzca. Este la proteger a usted y al beb en caso de accidente.  Evite comer carne cruda y el contacto con los utensilios y desperdicios de los gatos. Estos elementos contienen grmenes que pueden causar defectos de nacimiento en el beb.  Es fcil perder algo de orina durante el embarazo. Apretar y fortalecer los msculos de la pelvis la ayudar con este problema. Practique detener la miccin cuando est en el bao. Estos son los mismos msculos   que necesita fortalecer. Son tambin los mismos msculos que utiliza cuando trata de evitar despedir gases. Puede practicar apretando estos msculos diez veces, y repetir esto tres veces por da aproximadamente. Una vez que conozca qu msculos debe apretar, no realice estos ejercicios durante la miccin. Puede favorecerle una infeccin si la orina vuelve hacia atrs.  Pida ayuda si tienen necesidades financieras, teraputicas o nutricionales. El profesional podr ayudarla con respecto a estas necesidades, o derivarla a otros especialistas.  Haga una lista de nmeros telefnicos de emergencia y tngalos disponibles.  Planifique como obtener ayuda de familiares o amigos cuando regrese a casa desde el hospital.  Hacer un ensayo sobre la partida al hospital.  Tome clases prenatales con el padre para entender, practicar y hacer preguntas sobre el trabajo de parto y el alumbramiento.  Preparar la habitacin del beb / busque una guardera.  No viaje fuera de la ciudad a menos que sea absolutamente necesario y con el asesoramiento de su mdico.  Use slo zapatos de  tacn bajo o sin tacn para tener mejor equilibrio y evitar cadas. USO DE MEDICAMENTOS Y CONSUMO DE DROGAS DURANTE EL EMBARAZO   Tome las vitaminas apropiadas para esta etapa tal como se le indic. Las vitaminas deben contener un miligramo de cido flico. Guarde todas las vitaminas fuera del alcance de los nios. La ingestin de slo un par de vitaminas o tabletas que contengan hierro pueden ocasionar la muerte en un beb o en un nio pequeo.  Evite el uso de todos los medicamentos, incluyendo hierbas, medicamentos de venta libre, sin receta o que no hayan sido sugeridos por su mdico. Slo tome medicamentos de venta libre o medicamentos recetados para el dolor, el malestar o fiebre como lo indique su mdico. No tome aspirina, ibuprofeno (Motrin, Advil, Nuprin) o naproxeno (Aleve) excepto que su mdico se lo indique.  Infrmele al profesional si consume alguna droga.  El alcohol se relaciona con ciertos defectos congnitos. Incluye el sndrome de alcoholismo fetal. Debe evitar absolutamente el consumo de alcohol, en cualquier forma. El fumar produce baja tasa de natalidad y bebs prematuros.  Las drogas ilegales o de la calle son muy perjudiciales para el beb. Estn absolutamente prohibidas. Un beb que nace de una madre adicta, ser adicto al nacer. Ese beb tendr los mismos sntomas de abstinencia que un adulto. SOLICITE ATENCIN MDICA SI:  Tiene preguntas o preocupaciones relacionadas con el embarazo. Es mejor que llame para formular las preguntas si no puede esperar hasta la prxima visita, que sentirse preocupada por ellas.  DECISIONES ACERCA DE LA CIRCUNCISIN  Usted puede saber o no cul es el sexo de su beb. Si ya sabe que ser un varn, este es el momento de pensar acerca de la circuncisin. La circuncisin es la extirpacin del prepucio. Esta es la piel que cubre el extremo sensible del pene. No hay un motivo mdico que lo justifique. Generalmente la decisin se toma segn lo que  sea popular en ese momento, o segn creencias religiosas. Podr conversar estos temas con su mdico o con el pediatra.  SOLICITE ATENCIN MDICA DE INMEDIATO SI:   La temperatura oral le sube a ms de 102 F (38.9 C) o lo que su mdico le indique.  Tiene una prdida de lquido por la vagina (canal de parto). Si sospecha una ruptura de las membranas, tmese la temperatura y llame al profesional para informarlo sobre esto.  Observa unas pequeas manchas, una hemorragia vaginal o elimina cogulos. Notifique al profesional acerca de la   cantidad y de cuntos apsitos est utilizando.  Presenta un olor desagradable en la secrecin vaginal y observa un cambio en el color, de transparente a blanco.  Ha vomitado durante ms de 24 horas.  Siente escalofros o le sube la fiebre.  Le falta el aire.  Siente ardor al orinar.  Baja o sube ms de 2 libras (900 g), o segn lo indicado por el profesional que la asiste.  Observa que sbitamente se le hinchan el rostro, las manos, los pies o las piernas.  Siente dolor en el vientre (abdominal). Las molestias en el ligamento redondo son una causa benigna frecuente de dolor abdominal durante el embarazo. El profesional que la asiste deber evaluarla.  Presenta dolor de cabeza intenso que no se alivia.  Tiene problemas visuales, visin doble o borrosa.  Si no siente los movimientos del beb durante ms de 1 hora. Si piensa que el beb no se mueve tanto como lo haca habitualmente, coma algo que contenga azcar y recustese sobre el lado izquierdo durante una hora. El beb debe moverse al menos 4  5 veces por hora. Comunquese inmediatamente si el beb se mueve menos que lo indicado.  Se cae, se ve involucrada en un accidente automovilstico o sufre algn tipo de traumatismo.  En su hogar hay violencia mental o fsica. Document Released: 05/23/2005 Document Revised: 02/12/2012 [employer] Patient Information 2013 [employer].  

## 2012-06-30 NOTE — Addendum Note (Signed)
Addended by: Franchot Mimes on: 06/30/2012 03:16 PM   Modules accepted: Orders

## 2012-06-30 NOTE — Progress Notes (Signed)
Diabetes Education:  Provided one box True Track strips.  Lot: [id number]  Exp: 2014/08/12.  Denies any problems with meter or monitoring process.  Maggie Blaise Palladino, RN, RD, CDE

## 2012-06-30 NOTE — Progress Notes (Signed)
Fasting blood sugar 77-92, PP 95-120.  Diet controlled. Wt loss 1 lb.  Had acute gastroenteritis last week.  28 week labs today.

## 2012-07-01 LAB — CBC
MCV: 83.7 fL (ref 78.0–100.0)
Platelets: 258 10*3/uL (ref 150–400)
RBC: 4.47 MIL/uL (ref 3.87–5.11)
WBC: 7.5 10*3/uL (ref 4.0–10.5)

## 2012-07-14 ENCOUNTER — Ambulatory Visit (INDEPENDENT_AMBULATORY_CARE_PROVIDER_SITE_OTHER): Payer: Self-pay | Admitting: Family Medicine

## 2012-07-14 ENCOUNTER — Telehealth: Payer: Self-pay | Admitting: Medical

## 2012-07-14 VITALS — BP 121/62 | Temp 98.0°F | Wt 206.0 lb

## 2012-07-14 DIAGNOSIS — O34219 Maternal care for unspecified type scar from previous cesarean delivery: Secondary | ICD-10-CM

## 2012-07-14 DIAGNOSIS — O24919 Unspecified diabetes mellitus in pregnancy, unspecified trimester: Secondary | ICD-10-CM

## 2012-07-14 DIAGNOSIS — R8281 Pyuria: Secondary | ICD-10-CM

## 2012-07-14 DIAGNOSIS — R82998 Other abnormal findings in urine: Secondary | ICD-10-CM

## 2012-07-14 LAB — POCT URINALYSIS DIP (DEVICE)
Bilirubin Urine: NEGATIVE
Ketones, ur: NEGATIVE mg/dL
pH: 6 (ref 5.0–8.0)

## 2012-07-14 MED ORDER — CEPHALEXIN 500 MG PO CAPS: 500.0000 mg | ORAL_CAPSULE | Freq: Four times a day (QID) | ORAL | Status: DC

## 2012-07-14 NOTE — Progress Notes (Signed)
Pulse: 65

## 2012-07-14 NOTE — Patient Instructions (Signed)
Toma 1/2 tableta (1.25 mg) glyburide con la cena. Sigue midiendo Neurosurgeon.  Embarazo  Systems analyst trimestre  (Pregnancy - Third Trimester) El tercer trimestre del Psychiatrist (los ltimos 3 meses) es el perodo en el cual tanto usted como su beb crecen con ms rapidez. El beb alcanza un largo de aproximadamente 50 cm. y pesa entre 2,700 y 4,500 kg. El beb gana ms tejido graso y est listo para la vida fuera del cuerpo de la [place]. Mientras estn en el interior, los bebs tienen perodos de sueo y vigilia, Warehouse manager y tienen hipo. Quizs sienta pequeas contracciones del tero. Este es el falso trabajo de [place]. Tambin se las conoce como contracciones de [place] . Es como una prctica del parto. Los problemas ms habituales de esta etapa del embarazo incluyen mayor dificultad para respirar, hinchazn de las manos y los pies por retencin de lquidos y la necesidad de Geographical information systems officer con ms frecuencia debido a que el tero y el beb presionan sobre la vejiga.  EXAMENES PRENATALES   Durante los Manpower Inc, deber seguir realizndose anlisis de [place]. Estas pruebas se realizan para controlar su salud y la del beb. Los [employer] Corporation de sangre se Radiographer, therapeutic para [employer] niveles de algunos compuestos de la sangre (hemoglobina). La anemia (bajo nivel de hemoglobina) es frecuente durante el embarazo. Para prevenirla, se administran hierro y vitaminas. Tambin le tomarn nuevas anlisis para descartar diabetes. Podrn repetirle algunas de las Hov[employer] Enterprises hicieron previamente.  En cada visita le medirn el tamao del tero. Esto permite asegurar que el beb se desarrolla adecuadamente, segn la fecha del embarazo.  Le controlarn la presin arterial en cada visita prenatal. Esto es para asegurarse de que no sufre toxemia.  Le harn un anlisis de orina en cada visita prenatal, para descartar infecciones, diabetes y la presencia de protenas.  Tambin en cada visita controlarn su peso. Esto se  realiza para asegurarse que aumenta de peso al ritmo indicado y que usted y su beb evolucionan normalmente.  En algunas ocasiones se realiza una prueba de ultrasonido para confirmar el correcto desarrollo y evolucin del beb. Esta prueba se realiza con ondas sonoras inofensivas para el beb, de modo que el profesional pueda calcular ms precisamente la fecha del [place].  Analice con su mdico los analgsicos y la anestesia que recibir durante el [place] de parto y [place].  Comente la posibilidad de que necesite una cesrea y qu anestesia se recibir.  Informe a su mdico si sufre violencia familiar mental o fsica. A veces, se indica la prueba especializada sin estrs, la prueba de tolerancia a las contracciones y el perfil biofsico para asegurarse de que el beb no tiene problemas. El estudio del lquido amnitico que rodea al beb se llama amniocentesis. El lquido amnitico se obtiene introduciendo una aguja en el vientre (abdomen ). En ocasiones se lleva a cabo cerca del final del embarazo, si es necesario inducir a un parto. En este caso se realiza para asegurarse que los pulmones del beb estn lo suficientemente maduros como para que pueda vivir fuera del tero. Si los pulmones no han madurado y es peligroso que el beb nazca, se Building services engineer a la madre una inyeccin de [place] , 1 a 2 [address]  [address] antes del [address]. Vivia Budge ayuda a que los pulmones del beb maduren y sea ms seguro su nacimiento.  CAMBIOS QUE OCURREN EN EL TERCER TRIMESTRE DEL EMBARAZO  Su organismo atravesar numerosos cambios durante el [place]. Estos pueden variar de [employer]  persona a otra. Converse con el profesional que la asiste acerca los cambios que usted note y que la preocupen.   Durante el ltimo trimestre probablemente sienta un aumento del apetito. Es normal tener "antojos" de Development worker, community. Esto vara de Neomia Dear persona a otra y de un embarazo a Therapist, art.  Podrn aparecer las primeras estras en las caderas, abdomen y [place].  Estos son cambios normales del cuerpo durante el [place]. No existen medicamentos ni ejercicios que puedan prevenir CarMax.  La constipacin puede tratarse con un laxante o agregando fibra a su dieta. Beber grandes cantidades de lquidos, tomar fibras en forma de vegetales, frutas y granos integrales es de gran [place].  Tambin es beneficioso practicar actividad fsica. Si ha sido una persona Engineer, mining, podr continuar con la Harley[employer]son de las actividades durante el mismo. Si ha sido [employer], puede ser beneficioso que comience con un programa de ejercicios, Museum/gallery exhibitions officer. Consulte con el profesional que la asiste antes de comenzar un programa de ejercicios.  Evite el consumo de cigarrillos, el alcohol, los medicamentos no recetados y las "drogas de la calle" durante el Psychiatrist. Estas sustancias qumicas afectan la formacin y el desarrollo del beb. Evite estas sustancias durante todo el embarazo para asegurar el nacimiento de un beb sano.  Podr sentir dolor de espalda, tener vrices en las venas y hemorroides, o si ya los sufra, pueden [place].  Durante el tercer trimestre se cansar con ms facilidad, lo cual es normal.  Los movimientos del beb pueden ser ms fuertes y con ms frecuencia.  Puede que note dificultades para respirar normalmente.  El ombligo puede salir hacia afuera.  A veces sale Veterinary surgeon de las [place], que se llama Product manager.  Podr aparecer Neomia Dear secrecin mucosa con sangre. Esto suele ocurrir General Electric unos [address] y Neomia Dear semana antes del [place]. INSTRUCCIONES PARA EL CUIDADO EN EL HOGAR   Cumpla con las citas de control. Siga las indicaciones del mdico con respecto al uso de [place], los ejercicios y la dieta.  Durante el embarazo debe obtener nutrientes para usted y para su beb. Consuma alimentos balanceados a intervalos regulares. Elija alimentos como carne, pescado, [place] y otros productos lcteos descremados,  vegetales, frutas, panes integrales y cereales. El [employer]ot informar cul es el aumento de peso ideal.  Las relaciones sexuales pueden continuarse hasta casi el final del embarazo, si no se presentan otros problemas como prdida prematura (antes de [place]) de lquido amnitico, hemorragia vaginal o dolor en el vientre (abdominal).  Realice Tes[employer] Corporation, si no tiene restricciones. Consulte con el profesional que la asiste si no sabe con certeza si determinados ejercicios son seguros. El mayor aumento de peso se producir en los ltimos 2 trimestres del Psychiatrist. El ejercicio ayuda a:  Engineering geologist.  Mantenerse en forma para el trabajo de parto y [place] .  Perder peso despus del parto.  Haga reposo con frecuencia, con las piernas elevadas, o segn lo necesite para evitar los calambres y el dolor de cintura.  Use un buen sostn o como los que se usan para hacer deportes para Paramedic la sensibilidad de las [place]. Tambin puede serle til si lo [place] mientras duerme. Si pierde Product manager, podr [employer]in.  No utilice la baera con agua caliente, baos turcos y saunas.  Colquese el cinturn de seguridad cuando conduzca. Este la proteger a usted y al beb en caso de accidente.  Evite comer carne  cruda y el contacto con los utensilios y desperdicios de los gatos. Estos elementos contienen grmenes que pueden causar defectos de nacimiento en el beb.  Es fcil perder algo de orina durante el [place]. Apretar y Chief Operating Officer los msculos de la pelvis la ayudar con este problema. Practique detener la miccin cuando est en el bao. Estos son los mismos msculos que Development worker, international aid. Son [employer] Partners mismos msculos que utiliza cuando trata de evitar despedir gases. Puede practicar apretando estos msculos Well[employer], y repetir esto tres veces por da aproximadamente. Una vez que conozca qu msculos debe apretar, no realice estos ejercicios durante la  miccin. Puede favorecerle una infeccin si la orina vuelve hacia atrs.  Pida ayuda si tienen necesidades financieras, teraputicas o nutricionales. El profesional podr ayudarla con respecto a estas necesidades, o derivarla a otros especialistas.  Haga una lista de nmeros telefnicos de emergencia y tngalos disponibles.  Planifique como obtener ayuda de familiares o amigos cuando regrese a Programmer, applications hospital.  Hacer un ensayo sobre la partida al hospital.  Hazel Dell clases prenatales con el padre para entender, practicar y hacer preguntas sobre el [place] de parto y el alumbramiento.  Preparar la habitacin del beb / busque Fatima Blank.  No viaje fuera de la ciudad a menos que sea absolutamente necesario y con el asesoramiento de su mdico.  Use slo zapatos de tacn bajo o sin tacn para tener mejor equilibrio y Automotive engineer cadas. USO DE MEDICAMENTOS Y CONSUMO DE DROGAS DURANTE EL [facility]   Tome las vitaminas apropiadas para esta etapa tal como se le indic. Las vitaminas deben contener un miligramo de cido flico. Guarde todas las vitaminas fuera del alcance de los nios. La ingestin de slo un par de vitaminas o tabletas que contengan hierro pueden ocasionar la [employer] Mining en un beb o en un nio pequeo.  Evite el uso de [employer] Mutual [employer] Omaha, incluyendo hierbas, medicamentos de [place], sin receta o que no hayan sido sugeridos por su mdico. Slo tome medicamentos de venta libre o medicamentos recetados para Chief Technology Officer, Environmental health practitioner o fiebre como lo indique su mdico. No tome aspirina, ibuprofeno (Motrin, Advil, Nuprin) o naproxeno (Aleve) excepto que su mdico se lo indique.  Infrmele al profesional si consume alguna droga.  El alcohol se relaciona con ciertos defectos congnitos. Incluye el sndrome de alcoholismo fetal. Debe evitar absolutamente el consumo de alcohol, en cualquier forma. El fumar produce baja tasa de natalidad y bebs prematuros.  Las drogas ilegales o de la  calle son muy perjudiciales para el beb. Estn absolutamente prohibidas. Un beb que nace de [employer] Express, ser adicto al nacer. Ese beb tendr los mismos sntomas de abstinencia que un adulto. SOLICITE ATENCIN MDICA SI:  Tiene preguntas o preocupaciones relacionadas con el embarazo. Es mejor que llame para formular las preguntas si no puede esperar hasta la prxima visita, que sentirse preocupada por ellas.  DECISIONES ACERCA DE LA CIRCUNCISIN  Usted puede saber o no cul es el sexo de su beb. Si ya sabe que ser un varn, este es el momento de pensar acerca de la circuncisin. La circuncisin es la extirpacin del prepucio. Esta es la piel que cubre el extremo sensible del pene. No hay un motivo mdico que lo justifique. Generalmente la decisin se toma segn lo que sea popular en ese momento, o segn creencias religiosas. Podr conversar estos temas con su mdico o con el pediatra.  SOLICITE ATENCIN MDICA DE INMEDIATO SI:   La temperatura oral le  sube a ms de 102 F (38.9 C) o lo que su mdico le indique.  Tiene una prdida de lquido por la vagina (canal de parto). Si sospecha una ruptura de las [place], tmese la temperatura y llame al profesional para informarlo sobre esto.  Observa unas pequeas manchas, una hemorragia vaginal o elimina cogulos. Notifique al profesional acerca de la cantidad y de cuntos apsitos est utilizando.  Presenta un olor desagradable en la secrecin vaginal y observa un cambio en el color, de transparente a blanco.  Ha vomitado durante ms de 24 horas.  Siente escalofros o le sube la fiebre.  Le falta el aire.  Siente ardor al Beatrix Shipper.  Baja o sube ms de 2 libras (900 g), o segn lo indicado por el profesional que la asiste.  Observa que sbitamente se le hinchan el rostro, las manos, los pies o las piernas.  Siente dolor en el vientre (abdominal). Las [employer]ogul en el ligamento redondo son Neomia Dear causa benigna frecuente de dolor abdominal  durante el embarazo. El profesional que la asiste deber evaluarla.  Presenta dolor de cabeza intenso que no se [place].  Tiene problemas visuales, visin doble o borrosa.  Si no siente los movimientos del beb durante ms de 1 hora. Si piensa que el beb no se mueve tanto como lo haca habitualmente, coma algo que Psychologist, clinical y Target Corporation lado izquierdo durante [place]. El beb debe moverse al menos 4  5 veces por hora. Comunquese inmediatamente si el beb se mueve menos que lo indicado.  Se cae, se ve involucrada en un accidente automovilstico o sufre algn tipo de traumatismo.  En su hogar hay violencia mental o fsica. Document Released: 05/23/2005 Document Revised: 02/12/2012 [facility] Patient Information 2013 [place], [place].  Diabetes mellitus gestacional (Gestational Diabetes Mellitus) La diabetes mellitus gestacional se produce slo durante el embarazo. Aparece cuando el organismo no puede controlar adecuadamente la glucosa (azcar) que aumenta en la sangre despus de comer. Durante el [place], se produce una resistencia a la insulina (sensibilidad reducida a la insulina) debido a la liberacin de hormonas por parte de la placenta. Generalmente, el pncreas de una mujer embarazada produce la cantidad suficiente de insulina para vencer esa resistencia. Sin embargo, en la diabetes gestacional, hay insulina pero no cumple su funcin adecuadamente. Si la resistencia es lo suficientemente grave como para que el pncreas no produzca la cantidad de insulina suficiente, la glucosa extra se acumula en la sangre.  Devota Pace RIESGO DE DESARROLLAR DIABETES GESTACIONAL?  Las mujeres con historia de diabetes en la familia.  Las mujeres de ms de [address]  Las que presentan sobrepeso.  Las [employer] Corporation pertenecen a ciertos grupos tnicos (latinas, afroamericanas, norteamericanas nativas, asiticas y las originarias de las islas del Pacfico. QUE PUEDE OCURRIRLE AL BEB? Si  el nivel de glucosa en sangre de la madre es demasiado elevado mientras este [place], el nivel extra de azcar pasar por el cordn umbilical hacia el beb. Algunos de los problemas del beb pueden ser:  Beb demasiado grande: si el nio recibe Chief Strategy Officer, puede aumentar mucho de [place]. Esto puede hacer que sea demasiado grande para nacer por parto normal (vaginal) por lo que ser necesario realizar una cesrea.  Bajo nivel de glucosa (hipoglucemia): el beb produce insulina extra en respuesta a la excesiva cantidad de azcar que obtiene de DTE Energy Company. Cuando el beb nace y ya no necesita insulina extra, su nivel de azcar en sangre puede disminuir.  Ictericia (coloracin amarillenta de  la piel y los ojos): esto es bastante frecuente en los bebs. La causa es la acumulacin de una sustancia qumica denominada bilirrubina. No siempre es un trastorno grave, pero se observa con frecuencia en los bebs cuyas madres sufren diabetes gestacional. RIESGOS PARA LA MADRE Las mujeres que han sufrido diabetes gestacional pueden tener ms riesgos para algunos problemas como:  Preeclampsia o toxemia, incluyendo problemas con hipertensin arterial. La presin arterial y los niveles de protenas en la orina deben controlarse con frecuencia.  Infecciones  Parto por cesrea.  Aparicin de diabetes tipo 2 en una etapa posterior de la vida. Alrededor del 30% al 50% sufrir diabetes posteriormente, especialmente las que son obesas. DIAGNSTICO Las hormonas que causan resistencia a la insulina tienen su mayor nivel alrededor de las 24 a 28 semanas del Psychiatrist. Si se experimentan sntomas, stos son similares a los sntomas que normalmente aparecen durante el embarazo.  La diabetes mellitus gestacional generalmente se diagnostica por medio de un mtodo en dos partes: 1. Despus de la 24 a 28 semanas de Psychiatrist, la mujer debe beber una solucin que contiene glucosa y Education officer, environmental un anlisis de [place]. Si el nivel  de glucosa es elevado, la realizarn un segundo [place]. 2. La prueba oral de tolerancia a la glucosa, que dura aproximadamente tres horas. Despus de realizar ayuno durante la noche, se controla nivel de glucosa en sangre. La mujer bebe una solucin que contiene glucosa y Chief Executive Officer realizan anlisis de glucosa en sangre cada hora. Si la mujer tiene factores de riesgos para la diabetes mellitus gestacional, el mdico podr Programme researcher, broadcasting/film/video anlisis antes de las 24 semanas de [place]. TRATAMIENTO El tratamiento est dirigido a Insurance underwriter en sangre de la madre en un nivel normal y puede incluir:  La planificacin de los alimentos.  Recibir insulina u otro medicamento para Sales executive nivel de glucosa en [place].  La prctica de ejercicios.  Llevar un registro diario de los alimentos que consume.  Control y Engineer, maintenance (IT) de los niveles de glucosa en Rafael Hernandez.  Control de los niveles de cetona en la [place], [place] esto ya no se considera necesario en la mayora de los [place]. INSTRUCCIONES PARA EL CUIDADO DOMICILIARIO Mientras est embarazada:  Siga los consejos de su mdico relacionados con los controles prenatales, la planificacin de la comida, la actividad fsica, los [place], vitaminas, los anlisis de sangre y otras pruebas y las actividades fsicas.  Lleve un registro de las comidas, las pruebas de glucosa en sangre y la cantidad de insulina que recibe (si corresponde). Muestre todo al profesional en cada consulta mdica prenatal.  Si sufre diabetes mellitus gestacional, podr tener problemas de hipoglucemia (nivel bajo de glucosa en sangre). Podr sospechar este problema si se siente repentinamente mareada, tiene temblores y/o se siente dbil. Si cree que esto le est ocurriendo, y tiene un medidor de glucosa, mida su nivel de Event organiser. Siga los consejos de su mdico sobre el modo y el momento de tratar su nivel de glucosa en sangre. Generalmente se sigue la regla 15:15 Consuma 15  g de hidratos de carbono, espere 15 minutos y Programmer, systems el nivel de glucosa en [place].Barbara Cower de 15 g de hidratos de carbono son:  1 taza de [employer].   taza de jugo.  3-4 tabletas de glucosa.  5-6 caramelos duros.  1 caja pequea de pasas de uva.   taza de gaseosa comn.  Mantenga una buena higiene para evitar infecciones.  No fume. SOLICITE ATENCIN MDICA SI:  Observa prdida vaginal con o sin picazn.  Se siente ms dbil o cansada que lo habitual.  Primus [place].  Tiene un aumento de peso repentino, 2,5 kg o ms en una semana.  Pierde peso, 1.5 kg o ms en una semana.  Su nivel de glucosa en sangre es elevado, necesita instrucciones. SOLICITE ATENCIN MDICA DE INMEDIATO SI:  Sufre una cefalea intensa.  Se marea o pierde el conocimiento  Presenta nuseas o vmitos.  Se siente desorientada confundida.  Sufre convulsiones.  Tiene problemas de visin.  Siente Physiological scientist.  Presenta una hemorragia vaginal abundante.  Tiene contracciones uterinas.  Tiene una prdida importante de lquido por la vagina DESPUS QUE NACE EL BEB:  Concurra a todos los controles de seguimiento y Clinical biochemist los anlisis de sangre segn las indicaciones de su mdico.  Mantenga un estilo de vida saludable para evitar la diabetes en el futuro. Aqu se incluye:  Siga el plan de alimentacin saludable.  Controle su peso.  Practique actividad fsica y descanse lo necesario.  No fume.  Amamante a su beb mientras pueda. Esto disminuir la probabilidad de que usted y su beb sufran diabetes posteriormente. Para ms informacin acerca de la diabetes, visite la pgina web de Holiday representative Diabetes Association: PMFashions.com.cy. Para ms informacin acerca de la diabetes gestacional cite la pgina web del [employer] Kiewit [employer] of Obstetricians and Gynecologists en: RentRule.com.au. Document Released: 05/23/2005 Document Revised:  11/05/2011 [facility] Patient Information 2013 [place], [place].

## 2012-07-14 NOTE — Progress Notes (Signed)
Fasting BS 70-110 (9/15 over 90); PP 88-161 (6/42 over 120). C/o occasional crampy lower pelvic pains, esp when standing. Will try 1.25 mg glyburide at night since pt has had low sugars previously with 2.5 mg. If needs a snack, will try high-protein snack before bed. Begin antenatal testing at 32 weeks since has been on glyburide and is now on it again. A2 GDM.

## 2012-07-15 ENCOUNTER — Telehealth: Payer: Self-pay | Admitting: Medical

## 2012-07-15 NOTE — Telephone Encounter (Signed)
LM for patient to return call to clinic. Needs to be informed of UTI and Rx at pharmacy.  

## 2012-07-15 NOTE — Telephone Encounter (Signed)
Patient returned call to clinic. Spoke with patient with Ana. Informed her of UTI and Rx at pharmacy. Patient voiced no additional questions or concerns.

## 2012-07-15 NOTE — Telephone Encounter (Signed)
Message copied by Freddi Starr on Tue Jul 15, 2012  9:47 AM ------      Message from: FERRY, [place]      Created: Mon Jul 14, 2012  9:36 AM       Pt's urine had large number leukocytes. I sent rx for keflex to pharmacy but pt needs phone call to inform her about UTI and rx.

## 2012-07-16 LAB — CULTURE, OB URINE: Colony Count: 15000

## 2012-07-28 ENCOUNTER — Ambulatory Visit (INDEPENDENT_AMBULATORY_CARE_PROVIDER_SITE_OTHER): Payer: Self-pay | Admitting: Obstetrics & Gynecology

## 2012-07-28 VITALS — BP 102/62 | Temp 97.8°F | Wt 211.4 lb

## 2012-07-28 DIAGNOSIS — O24919 Unspecified diabetes mellitus in pregnancy, unspecified trimester: Secondary | ICD-10-CM

## 2012-07-28 DIAGNOSIS — O34219 Maternal care for unspecified type scar from previous cesarean delivery: Secondary | ICD-10-CM

## 2012-07-28 DIAGNOSIS — O0990 Supervision of high risk pregnancy, unspecified, unspecified trimester: Secondary | ICD-10-CM

## 2012-07-28 DIAGNOSIS — Z98891 History of uterine scar from previous surgery: Secondary | ICD-10-CM

## 2012-07-28 LAB — POCT URINALYSIS DIP (DEVICE)
Glucose, UA: NEGATIVE mg/dL
Nitrite: NEGATIVE
Specific Gravity, Urine: 1.02 (ref 1.005–1.030)
Urobilinogen, UA: 0.2 mg/dL (ref 0.0–1.0)

## 2012-07-28 NOTE — Progress Notes (Signed)
Pulse: 72

## 2012-07-28 NOTE — Progress Notes (Signed)
NST performed today was reviewed and was found to be reactive.  Continue recommended antenatal testing and prenatal care. CBGs excellent, continue Glyburide 2.5 mg qhs.  No other complaints or concerns.  Fetal movement and labor precautions reviewed.

## 2012-07-28 NOTE — Patient Instructions (Addendum)
Return to clinic for any obstetric concerns or go to MAU for evaluation  

## 2012-07-31 ENCOUNTER — Ambulatory Visit (INDEPENDENT_AMBULATORY_CARE_PROVIDER_SITE_OTHER): Payer: Self-pay | Admitting: *Deleted

## 2012-07-31 VITALS — BP 124/74 | Temp 98.6°F | Wt 212.8 lb

## 2012-07-31 DIAGNOSIS — L293 Anogenital pruritus, unspecified: Secondary | ICD-10-CM

## 2012-07-31 DIAGNOSIS — O99810 Abnormal glucose complicating pregnancy: Secondary | ICD-10-CM

## 2012-07-31 DIAGNOSIS — O24919 Unspecified diabetes mellitus in pregnancy, unspecified trimester: Secondary | ICD-10-CM

## 2012-07-31 DIAGNOSIS — N898 Other specified noninflammatory disorders of vagina: Secondary | ICD-10-CM

## 2012-07-31 MED ORDER — FLUCONAZOLE 150 MG PO TABS: 150.0000 mg | ORAL_TABLET | Freq: Once | ORAL | Status: DC

## 2012-07-31 NOTE — Progress Notes (Signed)
P=87,  here for NST, Used [employer]. C/o vaginal itching and thick white discharge. RX for diflucan sent to pharmacy and explained to pt.

## 2012-08-04 ENCOUNTER — Ambulatory Visit (INDEPENDENT_AMBULATORY_CARE_PROVIDER_SITE_OTHER): Payer: Self-pay | Admitting: Obstetrics & Gynecology

## 2012-08-04 VITALS — BP 116/68 | Temp 98.2°F | Wt 210.3 lb

## 2012-08-04 DIAGNOSIS — O24919 Unspecified diabetes mellitus in pregnancy, unspecified trimester: Secondary | ICD-10-CM

## 2012-08-04 LAB — POCT URINALYSIS DIP (DEVICE)
Bilirubin Urine: NEGATIVE
Glucose, UA: NEGATIVE mg/dL
Nitrite: NEGATIVE

## 2012-08-04 MED ORDER — TRIAMCINOLONE ACETONIDE 0.5 % EX OINT: TOPICAL_OINTMENT | Freq: Two times a day (BID) | CUTANEOUS | Status: DC

## 2012-08-04 NOTE — Progress Notes (Signed)
All CBGs are WNL; Pt wants to VBAC.  Needs to sign VBAC consent today.  NST reactive.  Pt complaining of itching and burning.   HSV, Gc/Chlam, wet prep done.  Will also give triamcenalone cream bid for one week.

## 2012-08-04 NOTE — Addendum Note (Signed)
Addended by: Jill Side on: 08/04/2012 10:42 AM   Modules accepted: Orders

## 2012-08-04 NOTE — Addendum Note (Signed)
Addended by: Sherre Lain A on: 08/04/2012 11:22 AM   Modules accepted: Orders

## 2012-08-04 NOTE — Progress Notes (Signed)
P=71, Used Interpreter [employer], c/o still has thick white vaginal discharge and perineal area is swollen and itchy.

## 2012-08-06 LAB — HERPES SIMPLEX VIRUS CULTURE: Organism ID, Bacteria: NOT DETECTED

## 2012-08-08 ENCOUNTER — Other Ambulatory Visit: Payer: Self-pay | Admitting: Obstetrics & Gynecology

## 2012-08-08 ENCOUNTER — Ambulatory Visit (INDEPENDENT_AMBULATORY_CARE_PROVIDER_SITE_OTHER): Payer: Self-pay | Admitting: *Deleted

## 2012-08-08 VITALS — BP 111/62 | Wt 210.6 lb

## 2012-08-08 DIAGNOSIS — O99810 Abnormal glucose complicating pregnancy: Secondary | ICD-10-CM

## 2012-08-08 MED ORDER — FLUCONAZOLE 150 MG PO TABS: 150.0000 mg | ORAL_TABLET | Freq: Once | ORAL | Status: DC

## 2012-08-08 NOTE — Progress Notes (Signed)
P = 64   [place] growth scheduled 12/20 @ 0945

## 2012-08-11 ENCOUNTER — Ambulatory Visit (INDEPENDENT_AMBULATORY_CARE_PROVIDER_SITE_OTHER): Payer: Self-pay | Admitting: Obstetrics & Gynecology

## 2012-08-11 VITALS — BP 115/67 | Temp 97.7°F | Wt 213.0 lb

## 2012-08-11 DIAGNOSIS — O0990 Supervision of high risk pregnancy, unspecified, unspecified trimester: Secondary | ICD-10-CM

## 2012-08-11 DIAGNOSIS — Z98891 History of uterine scar from previous surgery: Secondary | ICD-10-CM

## 2012-08-11 DIAGNOSIS — O34219 Maternal care for unspecified type scar from previous cesarean delivery: Secondary | ICD-10-CM

## 2012-08-11 DIAGNOSIS — O24919 Unspecified diabetes mellitus in pregnancy, unspecified trimester: Secondary | ICD-10-CM

## 2012-08-11 LAB — POCT URINALYSIS DIP (DEVICE)
Bilirubin Urine: NEGATIVE
Glucose, UA: 250 mg/dL — AB
Hgb urine dipstick: NEGATIVE
Protein, ur: NEGATIVE mg/dL
Specific Gravity, Urine: 1.025 (ref 1.005–1.030)
pH: 6.5 (ref 5.0–8.0)

## 2012-08-11 NOTE — Progress Notes (Signed)
Next [place] growth on 12/20 @ 0945

## 2012-08-11 NOTE — Patient Instructions (Signed)
Parto vaginal luego de una cesrea (Vaginal Birth After Cesarean Delivery) Un parto vaginal luego de un parto por cesrea es dar a luz por la vagina luego de haber dado a luz por medio de una intervencin [place]. En el pasado, si una mujer tena un beb por cesrea, todos los partos posteriores deban hacerse por cesrea. Esto ya no es as. Puede ser seguro para la mam intentar un parto vaginal luego de una cesrea. La decisin final de tener un parto vaginal o por cesrea debe tomarse en conjunto, entre la paciente y el mdico. [place] riesgos y los beneficios debern evaluarse con relacin a los motivos y al tipo de cesrea previa LAS MUJERES QUE QUIEREN TENER UN PARTO VAGINAL, DEBEN CONSULTAR CON SU MDICO PARA ASEGURARSE QUE:  La cesrea anterior se realiz con una incisin uterina transversal (no con una incisin vertical clsica).  El canal de parto es lo suficientemente grande como para que pase el [place].  No ha sido sometida a otras operaciones del tero.  Durante el trabajo de parto le realizarn un monitoreo electrnico fetal, en todo momento.  Es necesario que haya un quirfano disponible y listo en caso de necesitar una cesrea de emergencia.  Un cirujano y personal de quirfano estarn disponibles en todo momento durante el [place] de parto, para realizar una cesrea en caso de ser necesario.  Habr un anestesista disponible en caso de necesitar una cesrea de emergencia.  La nursery est lista cuenta con personal especializado y el equipo disponible para cuidar al beb en caso de emergencia. BENEFICIOS  Permanencia ms breve en el hospital.  Menores costos en el parto, la nurse y el hospital.  Menos prdida de sangre y menos probabilidad de necesitar una transfusin sangunea.  Menos probabilidad de tener fiebre o molestias como consecuencia de una ciruga mayor  Menos riesgo de cogulos sanguneos.  Menos riesgo de sufrir infecciones.  Recuperacin ms rpida luego  del alta mdica.  Menos riesgo de complicaciones quirrgicas, como apertura o hernia de la incisin.  Disminucin del riesgo de lesiones a otros rganos  Menor riesgo de remocin del tero (histerectoma)  Menor riesgo de que la placenta cubra parcial o completamente la abertura del tero (placenta previa) en embarazos futuros  Posibilidad de tener una familia grande, si lo desea. RIESGOS  Ruptura del tero.  Si el tero se rompe Production manager.  Todas las complicaciones de [place] mayor y lesiones en otros rganos.  Hemorragia excesiva, cogulos e infeccin.  [place] Puntuacin Apgar baja (mtodo que evala al recin nacido segn su apariencia, pulso, muecas, actividad y respiracion) y ms riesgos para el beb.  Hay mas riesgo de ruptura del tero si se induce o aumenta el trabajo de [place].  Hay un mayor riesgo de ruptura uterina si se usan medicamentos para madurar el cuello. NO DEBE LLEVARSE A CABO SI:  La cesrea previa se realiz con una incicin vertical (clsica) o con forma de T, o usted no sabe cul de [employer] han practicado.  Ha sufrido ruptura del tero.  Le han practicado una ciruga de tero.  Tiene problemas mdicos u obsttricos.  El beb est en problemas.  Tuvo dos cesreas previas y ningn parto vaginal. OTRAS COSAS QUE DEBE SABER:  La anestesia peridural es segura.  Es seguro dar vuelta al beb si se encuentra de nalgas (intentar una versin ceflica externa).  Es seguro intentarlo en caso de mellizos.  Los embarazos de ms de 40 semanas no tienen  xito con este procedimiento.  Hay un aumento de fracasos en embarazadas obesas.  Hay un aumento del porcentaje de fracasos si el beb pesa 4 Kg o ms.  Hay aumento en el porcentaje de fracasos si el intervalo entre la operacin cesrea y el parto vaginal es de menos de 19 meses.  Hay un aumento en el porcentaje de fracasos si ha sufrido preeclampsia hipertensin arterial,  protenas en la orina e hinchazn del rostro y las extremidades.  El parto vaginal ser muy exitoso si tuvo un parto vaginal previo.  Tambin es [employer] caso que el [place] de parto comience espontneamente antes de la fecha.  El parto vaginal luego de Neomia Dear cesrea es similar a un parto espontneo vaginal normal. Es importante que converse con su mdico desde comienzos del Psychiatrist de modo que pueda Google, beneficios y opciones. De este modo tendr tiempo de decidir que es lo mejor en su caso particular en relacin a su parto por cesrea anterior. Hay que tener en cuenta que puede haber cambios en la madre durante el [place], lo que hace necesario cambiar su decisin o la del mdico. Los consejos, preocupaciones y decisiones debern documentarse en la historia clnica y debe ser firmada por todas las partes. Document Released: 01/30/2008 Document Revised: 11/05/2011 [facility] Patient Information 2013 [place], [place].

## 2012-08-11 NOTE — Progress Notes (Signed)
Discussed risks and benefit of TOLAC vs repeat CS, plans TOLAC, questions answered, signed consent with interpreter present FBS <83, PP <115 NST reactive today

## 2012-08-11 NOTE — Progress Notes (Signed)
Pulse: 73

## 2012-08-13 NOTE — Progress Notes (Addendum)
Called pt with Spanish interpreter Byrd Hesselbach and informed pt of her yeast infection and that diflucan has been sent to pharmacy which is a one time dose.  Pt stated understanding and we verified pharmacy with pt.  Pt had no further questions.

## 2012-08-15 ENCOUNTER — Ambulatory Visit (HOSPITAL_COMMUNITY)
Admission: RE | Admit: 2012-08-15 | Discharge: 2012-08-15 | Disposition: A | Discharge status: Home / Self Care | Payer: Self-pay | Source: Ambulatory Visit | Attending: Obstetrics and Gynecology | Admitting: Obstetrics and Gynecology

## 2012-08-15 ENCOUNTER — Ambulatory Visit (INDEPENDENT_AMBULATORY_CARE_PROVIDER_SITE_OTHER): Payer: Self-pay | Admitting: *Deleted

## 2012-08-15 VITALS — BP 111/62 | Wt 213.4 lb

## 2012-08-15 DIAGNOSIS — O34219 Maternal care for unspecified type scar from previous cesarean delivery: Secondary | ICD-10-CM | POA: Insufficient documentation

## 2012-08-15 DIAGNOSIS — O24919 Unspecified diabetes mellitus in pregnancy, unspecified trimester: Secondary | ICD-10-CM

## 2012-08-15 DIAGNOSIS — E669 Obesity, unspecified: Secondary | ICD-10-CM | POA: Insufficient documentation

## 2012-08-15 DIAGNOSIS — O99810 Abnormal glucose complicating pregnancy: Secondary | ICD-10-CM | POA: Insufficient documentation

## 2012-08-15 NOTE — Progress Notes (Signed)
12/13 NST reviewed and category I

## 2012-08-15 NOTE — Progress Notes (Signed)
P = 68      Pt reports decr. FM x2-3 days- felt good FM during NST.  Normal patterns of FM for current EGA discussed- interpreter Marlynn Perking present for visit.  [place] growth today.

## 2012-08-18 ENCOUNTER — Ambulatory Visit (INDEPENDENT_AMBULATORY_CARE_PROVIDER_SITE_OTHER): Payer: Self-pay | Admitting: Obstetrics & Gynecology

## 2012-08-18 VITALS — BP 96/65 | Temp 97.4°F | Wt 214.3 lb

## 2012-08-18 DIAGNOSIS — O0990 Supervision of high risk pregnancy, unspecified, unspecified trimester: Secondary | ICD-10-CM

## 2012-08-18 DIAGNOSIS — Z98891 History of uterine scar from previous surgery: Secondary | ICD-10-CM

## 2012-08-18 DIAGNOSIS — O24919 Unspecified diabetes mellitus in pregnancy, unspecified trimester: Secondary | ICD-10-CM

## 2012-08-18 DIAGNOSIS — O34219 Maternal care for unspecified type scar from previous cesarean delivery: Secondary | ICD-10-CM

## 2012-08-18 LAB — POCT URINALYSIS DIP (DEVICE)
Glucose, UA: NEGATIVE mg/dL
Hgb urine dipstick: NEGATIVE
Specific Gravity, Urine: 1.015 (ref 1.005–1.030)
Urobilinogen, UA: 0.2 mg/dL (ref 0.0–1.0)
pH: 6.5 (ref 5.0–8.0)

## 2012-08-18 NOTE — Progress Notes (Signed)
NST performed today was reviewed and was found to be reactive.  Continue recommended antenatal testing and prenatal care. CBGs under good control, continue Glyburide.  No other complaints or concerns.  Fetal movement and labor precautions reviewed.

## 2012-08-18 NOTE — Progress Notes (Signed)
Pt reports improved FM from 12/20.  Cultures next week

## 2012-08-18 NOTE — Patient Instructions (Signed)
Return to clinic for any obstetric concerns or go to [facility] for evaluation  

## 2012-08-18 NOTE — Progress Notes (Signed)
p=72 

## 2012-08-22 ENCOUNTER — Ambulatory Visit (INDEPENDENT_AMBULATORY_CARE_PROVIDER_SITE_OTHER): Payer: Self-pay | Admitting: *Deleted

## 2012-08-22 VITALS — BP 108/65 | Wt 214.7 lb

## 2012-08-22 DIAGNOSIS — O24919 Unspecified diabetes mellitus in pregnancy, unspecified trimester: Secondary | ICD-10-CM

## 2012-08-22 NOTE — Progress Notes (Signed)
P = 68   Pt encouraged to increase po water intake due to low normal AFI.  Interpreter- Dorita present for visit.

## 2012-08-25 ENCOUNTER — Encounter: Payer: Self-pay | Admitting: Family Medicine

## 2012-08-25 ENCOUNTER — Ambulatory Visit (INDEPENDENT_AMBULATORY_CARE_PROVIDER_SITE_OTHER): Payer: Self-pay | Admitting: Family Medicine

## 2012-08-25 VITALS — BP 109/69 | Temp 96.7°F | Wt 214.5 lb

## 2012-08-25 DIAGNOSIS — O34219 Maternal care for unspecified type scar from previous cesarean delivery: Secondary | ICD-10-CM

## 2012-08-25 DIAGNOSIS — Z98891 History of uterine scar from previous surgery: Secondary | ICD-10-CM

## 2012-08-25 DIAGNOSIS — O24919 Unspecified diabetes mellitus in pregnancy, unspecified trimester: Secondary | ICD-10-CM

## 2012-08-25 LAB — POCT URINALYSIS DIP (DEVICE)
Bilirubin Urine: NEGATIVE
Hgb urine dipstick: NEGATIVE
Nitrite: NEGATIVE
Protein, ur: NEGATIVE mg/dL
pH: 7 (ref 5.0–8.0)

## 2012-08-25 LAB — OB RESULTS CONSOLE GBS: GBS: NEGATIVE

## 2012-08-25 NOTE — Progress Notes (Signed)
NST 125 reactive

## 2012-08-25 NOTE — Patient Instructions (Signed)
Diabetes mellitus gestacional (Gestational Diabetes Mellitus) La diabetes mellitus gestacional se produce slo durante el embarazo. Aparece cuando el organismo no puede controlar adecuadamente la glucosa (azcar) que aumenta en la sangre despus de comer. Durante el [place], se produce una resistencia a la insulina (sensibilidad reducida a la insulina) debido a la liberacin de hormonas por parte de la placenta. Generalmente, el pncreas de una mujer embarazada produce la cantidad suficiente de insulina para vencer esa resistencia. Sin embargo, en la diabetes gestacional, hay insulina pero no cumple su funcin adecuadamente. Si la resistencia es lo suficientemente grave como para que el pncreas no produzca la cantidad de insulina suficiente, la glucosa extra se acumula en la sangre.  Devota Pace RIESGO DE DESARROLLAR DIABETES GESTACIONAL?  Las mujeres con historia de diabetes en la familia.  Las mujeres de ms de [address]  Las que presentan sobrepeso.  Las [employer] pertenecen a ciertos grupos tnicos (latinas, afroamericanas, norteamericanas nativas, asiticas y las originarias de las islas del Pacfico. QUE PUEDE OCURRIRLE AL BEB? Si el nivel de glucosa en sangre de la madre es demasiado elevado mientras este [place], el nivel extra de azcar pasar por el cordn umbilical hacia el beb. Algunos de los problemas del beb pueden ser:  Beb demasiado grande: si el nio recibe Chief Strategy Officer, puede aumentar mucho de [place]. Esto puede hacer que sea demasiado grande para nacer por parto normal (vaginal) por lo que ser necesario realizar una cesrea.  Bajo nivel de glucosa (hipoglucemia): el beb produce insulina extra en respuesta a la excesiva cantidad de azcar que obtiene de [employer]. Cuando el beb nace y ya no necesita insulina extra, su nivel de azcar en sangre puede disminuir.  Ictericia (coloracin amarillenta de la piel y los ojos): esto es bastante frecuente en los bebs.  La causa es la acumulacin de una sustancia qumica denominada bilirrubina. No siempre es un trastorno grave, pero se observa con frecuencia en los bebs cuyas madres sufren diabetes gestacional. RIESGOS PARA LA MADRE Las mujeres que han sufrido diabetes gestacional pueden tener ms riesgos para algunos problemas como:  Preeclampsia o toxemia, incluyendo problemas con hipertensin arterial. La presin arterial y los niveles de protenas en la orina deben controlarse con frecuencia.  Infecciones  Parto por cesrea.  Aparicin de diabetes tipo 2 en una etapa posterior de la vida. Alrededor del 30% al 50% sufrir diabetes posteriormente, especialmente las que son obesas. DIAGNSTICO Las hormonas que causan resistencia a la insulina tienen su mayor nivel alrededor de las 24 a 28 semanas del Psychiatrist. Si se experimentan sntomas, stos son similares a los sntomas que normalmente aparecen durante el embarazo.  La diabetes mellitus gestacional generalmente se diagnostica por medio de un mtodo en dos partes: 1. Despus de la 24 a 28 semanas de Psychiatrist, la mujer debe beber una solucin que contiene glucosa y Education officer, environmental un anlisis de [place]. Si el nivel de glucosa es elevado, la realizarn un segundo [place]. 2. La prueba oral de tolerancia a la glucosa, que dura aproximadamente tres horas. Despus de realizar ayuno durante la noche, se controla nivel de glucosa en sangre. La mujer bebe una solucin que contiene glucosa y Chief Executive Officer realizan anlisis de glucosa en sangre cada hora. Si la mujer tiene factores de riesgos para la diabetes mellitus gestacional, el mdico podr Programme researcher, broadcasting/film/video anlisis antes de las 24 semanas de [place]. TRATAMIENTO El tratamiento est dirigido a Insurance underwriter en sangre de la madre en un nivel normal y puede incluir:  La  planificacin de los alimentos.  Recibir insulina u otro medicamento para Sales executive nivel de glucosa en [place].  La prctica de ejercicios.  Llevar un  registro diario de los alimentos que consume.  Control y Engineer, maintenance (IT) de los niveles de glucosa en [place].  Control de los niveles de cetona en la [place], [place] esto ya no se considera necesario en la mayora de los [place]. INSTRUCCIONES PARA EL CUIDADO DOMICILIARIO Mientras est embarazada:  Siga los consejos de su mdico relacionados con los controles prenatales, la planificacin de la comida, la actividad fsica, los [place], vitaminas, los anlisis de sangre y otras pruebas y las actividades fsicas.  Lleve un registro de las comidas, las pruebas de glucosa en sangre y la cantidad de insulina que recibe (si corresponde). Muestre todo al profesional en cada consulta mdica prenatal.  Si sufre diabetes mellitus gestacional, podr tener problemas de hipoglucemia (nivel bajo de glucosa en sangre). Podr sospechar este problema si se siente repentinamente mareada, tiene temblores y/o se siente dbil. Si cree que esto le est ocurriendo, y tiene un medidor de glucosa, mida su nivel de Event organiser. Siga los consejos de su mdico sobre el modo y el momento de tratar su nivel de glucosa en sangre. Generalmente se sigue la regla 15:15 Consuma 15 g de hidratos de carbono, espere 15 minutos y Programmer, systems el nivel de glucosa en [place].Barbara Cower de 15 g de hidratos de carbono son:  1 taza de [employer].   taza de jugo.  3-4 tabletas de glucosa.  5-6 caramelos duros.  1 caja pequea de pasas de uva.   taza de gaseosa comn.  Mantenga una buena higiene para evitar infecciones.  No fume. SOLICITE ATENCIN MDICA SI:  Observa prdida vaginal con o sin picazn.  Se siente ms dbil o cansada que lo habitual.  [place]us [place].  Tiene un aumento de peso repentino, 2,5 kg o ms en una semana.  Pierde peso, 1.5 kg o ms en una semana.  Su nivel de glucosa en sangre es elevado, necesita instrucciones. SOLICITE ATENCIN MDICA DE INMEDIATO SI:  Sufre una cefalea  intensa.  Se marea o pierde el conocimiento  Presenta nuseas o vmitos.  Se siente desorientada confundida.  Sufre convulsiones.  Tiene problemas de visin.  Siente Physiological scientist.  Presenta una hemorragia vaginal abundante.  Tiene contracciones uterinas.  Tiene una prdida importante de lquido por la vagina DESPUS QUE NACE EL BEB:  Concurra a todos los controles de seguimiento y Clinical biochemist los anlisis de sangre segn las indicaciones de su mdico.  Mantenga un estilo de vida saludable para evitar la diabetes en el futuro. Aqu se incluye:  Siga el plan de alimentacin saludable.  Controle su peso.  Practique actividad fsica y descanse lo necesario.  No fume.  Amamante a su beb mientras pueda. Esto disminuir la probabilidad de que usted y su beb sufran diabetes posteriormente. Para ms informacin acerca de la diabetes, visite la pgina web de Holiday representative Diabetes Association: [email or web address].com.cy. Para ms informacin acerca de la diabetes gestacional cite la pgina web del [employer] of Obstetricians and Gynecologists en: RentRule.com.au. Document Released: 05/23/2005 Document Revised: 11/05/2011 [facility] Patient Information 2013 [place], [place].  Lactancia materna  (Breastfeeding) Decidir Museum/gallery exhibitions officer es una de las mejores elecciones que puede hacer por usted y su beb. La informacin que se brinda a Psychologist, clinical dar una breve visin de los beneficios de la lactancia materna as como de las dudas ms frecuentes alrededor de ella.  LOS BENEFICIOS DE AMAMANTAR  Para el beb   La primera leche (calostro ) ayuda al mejor funcionamiento del sistema digestivo del beb.   La leche tiene anticuerpos que provienen de la madre y que ayudan a prevenir las infecciones en el beb.   El beb tiene una menor incidencia de asma, alergias y del sndrome de muerte sbita del lactante (SMSL).   Los nutrientes en la [place] materna son mejores para  el beb que los preparados para lactantes y la [place] materna ayuda a un mejor desarrollo del cerebro del beb.   Los bebs amamantados tienen menos gases, clicos y estreimiento.  Para la mam   La lactancia materna favorece el desarrollo de un vnculo muy especial entre la madre y el beb.   Es ms conveniente, siempre disponible y a Landscape architect y Film/video editor.   Consume caloras en la madre y la ayuda a perder el peso ganado durante el [place].   Favorece la contraccin del tero a su tamao normal, de manera ms rpida y [employer] las hemorragias luego del [place].   Las [employer] Holdings que amamantan tienen menor riesgo de Geophysical data processor de mama.  FRECUENCIA DEL AMAMANTAMIENTO   Un beb sano, nacido a trmino, puede amamantarse con tanta frecuencia como cada hora, o espaciar las comidas cada tres horas.   Observe al beb cuando manifieste signos de hambre. Amamante a su beb si muestra signos de hambre. Esta frecuencia variar de un beb a otro.   Amamntelo tan seguido como el beb lo solicite, o cuando usted sienta la necesidad de Paramedic sus [place].   Despierte al beb si han pasado 3  4 horas desde la ltima comida.   El amamantamiento frecuente la ayudar a producir ms [place] y a Education officer, community de Engineer, mining en los pezones e hinchazn de las [place].  POSICIN DEL BEBE PARA EL AMAMANTAMIENTO   Ya sea que se encuentre [place] o sentada, asegrese que el abdomen del beb enfrente el suyo.   Sostenga la mama con el pulgar por arriba y los otros 4 dedos por debajo. Asegrese que sus dedos se encuentren lejos del pezn y de la boca del beb.   Empuje suavemente los labios del beb con el pezn o con el dedo.   Cuando la boca del beb se abra lo suficiente, introduzca el pezn y la areola tanto como le sea posible dentro de la boca.   Coloque al beb cerca suyo de modo que su nariz y mejillas toquen las mamas al [employer].  ALIMENTACIN Y SUCCIN   La duracin  de cada comida vara de un beb a otro y de Neomia Dear comida a [place].   El beb debe succionar entre 2 y 3 minutos para que Development worker, international aid. Esto se denomina "bajada". Por este motivo, permita que el nio se alimente en cada mama todo lo que desee. Terminar de mamar cuando haya recibido la cantidad [place] de nutrientes.   Para detener la succin coloque su dedo en la comisura de la boca del nio y Midwife entre sus encas antes de quitarle la mama de la boca. Esto la ayudar a English as a second language teacher.  COMO SABER SI EL BEB OBTIENE LA SUFICIENTE LECHE MATERNA  Preguntarse si el beb obtiene la cantidad suficiente de [place] es una preocupacin frecuente [employer]. Puede asegurarse que el beb tiene la leche suficiente si:   El beb succiona activamente y usted escucha que traga .   El beb parece estar  relajado y satisfecho despus de Psychologist, clinical.   El nio se alimenta al menos 8 a 12 veces en 24 horas. Alimntelo hasta que se desprenda por sus propios medios o se quede dormido en la primera mama (al menos durante 10 a 20 minutos), luego ofrzcale el otro lado.   El beb moja 5 a 6 paales desechables (6 a 8 paales de tela) en 24 horas cuando tiene 5  6 das de vida.   Tiene al Lowe's Companies 3 a 4 deposiciones todos los B[employer]on, Dickinson and Company primeros meses. La materia fecal debe ser blanda y [place].   El beb debe aumentar 4 a 6 libras (120 a 170 gr.) por semana despus de los 4 [address] de vida.   Siente que las mamas se ablandan despus de amamantar  REDUCIR LA CONGESTIN DE LAS MAMAS   Durante la primera semana despus del [place], usted puede experimentar hinchazn en las mamas. Cuando las mamas estn congestionadas, se sienten calientes, llenas y molestas al tacto. Puede reducir la congestin si:   Lo amamanta frecuentemente, cada 2-3 horas. Las mams que [employer] pronto y con frecuencia tienen menos problemas de [place].   Coloque compresas de hielo en sus mamas durante 10-20  minutos entre cada amamantamiento. Esto ayuda a Building services engineer. Envuelva las bolsas de hielo en una toalla liviana para proteger su piel. Las bolsas de vegetales congelados funcionan bien para este propsito.   Tome una ducha tibia o aplique compresas hmedas calientes en las mamas durante 5 a 10 minutos antes de cada vez que [place]. Esto aumenta la circulacin y [place] a que la [place].   Masajee suavemente la mama antes y Psychologist, sport and exercise. Con las puntas de los dedos, masajee desde la pared torcica hacia abajo hasta llegar al pezn, con movimientos circulares.   Asegrese que el nio vaca al menos una mama antes de cambiar de lado.   Use un sacaleche para vaciar la mama si el beb se duerme o no se alimenta bien. Tambin podr [employer] con esa bomba si tiene que volver al trabajo o siente que las mamas estn congestionadas.   Evite los biberones, chupetes o complementar la alimentacin con agua o jugos en lugar de la [place]. La leche materna es todo el alimento que el beb necesita. No es necesario que el nio ingiera agua o preparados de bibern. Louann [place], es lo mejor para ayudar a que las mamas produzcan ms [place]. no darle suplementos al [employer] las primeras semanas.   Verifique que el beb se encuentra en la posicin correcta mientras lo alimenta.   Use un sostn que soporte bien sus mamas y State [employer] Corporation que tienen aro.   Consuma una dieta balanceada y beba lquidos en cantidad.   Descanse con frecuencia, reljese y tome sus vitaminas prenatales para evitar la fatiga, el estrs y la anemia.  Si sigue estas indicaciones, la congestin debe mejorar en 24 a 48 horas. Si an tiene dificultades, consulte a Barista.  CUDESE USTED MISMA  Cuide sus mamas.   Bese o dchese diariamente.   Evite usar [employer].   Comience a amamantar del lado izquierdo en una comida y del lado derecho en la siguiente.   Notar que  aumenta el flujo de [place] a los 2 a 5 [address] despus del [address]. Puede sentir algunas molestias por la congestin, lo que hace que sus mamas estn duras y sensibles. La congestin disminuye en 24 a 48  horas. Mientras tanto, aplique toallas hmedas calientes durante 5 a 10 minutos antes de amamantar. Un masaje suave y la extraccin de un poco de leche antes de Museum/gallery exhibitions officer ablandarn las mamas y har ms fcil que el beb se agarre.   Use un buen sostn y seque al aire los pezones durante 3 a 4 minutos luego de Wellsite geologist.   Solo utilice apsitos de algodn.   Utilice lanolina [employer] pezones luego de [place]. No necesita lavarlos luego de alimentar al [employer]. Otra opcin es exprimir algunas gotas de [place] y [employer] Holdings pezones.  Cumpla con estos cuidados   Consuma alimentos bien balanceados y refrigerios nutritivos.   Dixie Dials, jugos de fruta y agua para Warehouse manager sed (alrededor de 8 vasos por Futures trader).   Descanse lo suficiente.  Evite los alimentos que usted note que pueden afectar al beb.  SOLICITE ATENCIN MDICA SI:   Tiene dificultad con la lactancia materna y [place].   Tiene una zona de color rojo, dura y dolorosa en la mama que se acompaa de [place].   El beb est muy somnoliento como para alimentarse bien o tiene problemas para dormir.   Su beb moja menos de 6 paales al [address], a los 5 [address] de vida.   La piel del beb o la parte blanca de sus ojos est ms amarilla de lo que estaba en el hospital.   Se siente deprimida.  Document Released: 08/13/2005 Document Revised: 02/12/2012 [facility] Patient Information 2013 [place], [place].

## 2012-08-25 NOTE — Assessment & Plan Note (Signed)
Doing well 

## 2012-08-25 NOTE — Progress Notes (Signed)
NST reviewed and reactive. FBS 65-86 2 hr pp 72-128 1 out of range. GBS culture today

## 2012-08-25 NOTE — Progress Notes (Signed)
Pulse: 66 Has pelvic pressure and contractions when walking.

## 2012-08-25 NOTE — Progress Notes (Signed)
NST 12-20 reactive 

## 2012-08-27 NOTE — L&D Delivery Note (Signed)
Delivery Note At 9:06 PM a viable female was delivered via successful spontaneous vaginal birth after cesarean section (Presentation:Left Occiput Anterior).  APGAR: 9, 9; weight:pending   Placenta status: Intact, Spontaneous.  Cord: 3 vessels with the following complications: None.  Cord pH: not done  Anesthesia: Epidural  Episiotomy: None Lacerations: 2nd degree perineal and non-hemostatic heavy flow peri-clitoral repaired by dr. [place] Large Suture Repair: 3.0 vicryl Est. Blood Loss (mL): 500  Mom to postpartum.  Baby to nursery-stable.  Breast and bottlefeeding.  Had wanted BTL but 'payment plan was denied' and she doesn't have money to pay for it.  Currently undecided about contraception- will talk to financial counselor.   Marge Duncans 09/06/2012, 9:47 PM

## 2012-08-28 ENCOUNTER — Encounter: Payer: Self-pay | Admitting: Family Medicine

## 2012-08-29 ENCOUNTER — Ambulatory Visit (INDEPENDENT_AMBULATORY_CARE_PROVIDER_SITE_OTHER): Payer: Self-pay | Admitting: *Deleted

## 2012-08-29 VITALS — BP 110/67 | Wt 216.6 lb

## 2012-08-29 DIAGNOSIS — O24919 Unspecified diabetes mellitus in pregnancy, unspecified trimester: Secondary | ICD-10-CM

## 2012-08-29 NOTE — Progress Notes (Signed)
P = 66       Interpreter- Dorita present for visit

## 2012-08-29 NOTE — Progress Notes (Signed)
NST performed today was reviewed and was found to be reactive.  AFI 9.8 cm.  Continue recommended antenatal testing and prenatal care.

## 2012-09-01 ENCOUNTER — Ambulatory Visit (INDEPENDENT_AMBULATORY_CARE_PROVIDER_SITE_OTHER): Payer: Self-pay | Admitting: Obstetrics & Gynecology

## 2012-09-01 VITALS — BP 115/66 | Wt 218.3 lb

## 2012-09-01 DIAGNOSIS — O24919 Unspecified diabetes mellitus in pregnancy, unspecified trimester: Secondary | ICD-10-CM

## 2012-09-01 DIAGNOSIS — Z98891 History of uterine scar from previous surgery: Secondary | ICD-10-CM

## 2012-09-01 DIAGNOSIS — O34219 Maternal care for unspecified type scar from previous cesarean delivery: Secondary | ICD-10-CM

## 2012-09-01 NOTE — Patient Instructions (Signed)
Return to clinic for any obstetric concerns or go to [facility] for evaluation  

## 2012-09-01 NOTE — Progress Notes (Signed)
Pulse- 63  Pain/pressure- lower abd Vaginal discharge- clear

## 2012-09-01 NOTE — Progress Notes (Signed)
NST performed today was reviewed and was found to be reactive.  Continue recommended antenatal testing and prenatal care.  Blood sugars all within good range, continue Glyburide 2.5mg  po qhs. No other complaints or concerns.  Fetal movement and labor precautions reviewed.

## 2012-09-03 ENCOUNTER — Encounter: Payer: Self-pay | Admitting: *Deleted

## 2012-09-05 ENCOUNTER — Ambulatory Visit (INDEPENDENT_AMBULATORY_CARE_PROVIDER_SITE_OTHER): Payer: Self-pay | Admitting: *Deleted

## 2012-09-05 ENCOUNTER — Encounter (HOSPITAL_COMMUNITY): Payer: Self-pay | Admitting: *Deleted

## 2012-09-05 ENCOUNTER — Ambulatory Visit (HOSPITAL_COMMUNITY)
Admission: RE | Admit: 2012-09-05 | Discharge: 2012-09-05 | Disposition: A | Discharge status: Home / Self Care | Payer: Self-pay | Source: Ambulatory Visit | Attending: Obstetrics & Gynecology | Admitting: Obstetrics & Gynecology

## 2012-09-05 ENCOUNTER — Encounter: Payer: Self-pay | Admitting: *Deleted

## 2012-09-05 ENCOUNTER — Inpatient Hospital Stay (HOSPITAL_COMMUNITY)
Admission: AD | Admit: 2012-09-05 | Discharge: 2012-09-08 | DRG: 775 | Disposition: A | Discharge status: Home / Self Care | Payer: Medicaid Other | Source: Ambulatory Visit | Attending: Obstetrics & Gynecology | Admitting: Obstetrics & Gynecology

## 2012-09-05 VITALS — BP 126/64 | Wt 220.9 lb

## 2012-09-05 DIAGNOSIS — E669 Obesity, unspecified: Secondary | ICD-10-CM | POA: Insufficient documentation

## 2012-09-05 DIAGNOSIS — O34219 Maternal care for unspecified type scar from previous cesarean delivery: Secondary | ICD-10-CM | POA: Insufficient documentation

## 2012-09-05 DIAGNOSIS — O4100X0 Oligohydramnios, unspecified trimester, not applicable or unspecified: Principal | ICD-10-CM | POA: Diagnosis present

## 2012-09-05 DIAGNOSIS — O24919 Unspecified diabetes mellitus in pregnancy, unspecified trimester: Secondary | ICD-10-CM

## 2012-09-05 DIAGNOSIS — O99810 Abnormal glucose complicating pregnancy: Secondary | ICD-10-CM | POA: Insufficient documentation

## 2012-09-05 DIAGNOSIS — O99814 Abnormal glucose complicating childbirth: Secondary | ICD-10-CM | POA: Diagnosis present

## 2012-09-05 DIAGNOSIS — O0990 Supervision of high risk pregnancy, unspecified, unspecified trimester: Secondary | ICD-10-CM

## 2012-09-05 LAB — CBC
MCH: 28.3 pg (ref 26.0–34.0)
MCV: 83.2 fL (ref 78.0–100.0)
Platelets: 224 10*3/uL (ref 150–400)
RDW: 14 % (ref 11.5–15.5)
WBC: 9.3 10*3/uL (ref 4.0–10.5)

## 2012-09-05 LAB — GLUCOSE, CAPILLARY: Glucose-Capillary: 65 mg/dL — ABNORMAL LOW (ref 70–99)

## 2012-09-05 MED ORDER — LIDOCAINE HCL (PF) 1 % IJ SOLN
30.0000 mL | INTRAMUSCULAR | Status: DC | PRN
  Administered 2012-09-06: 30 mL via SUBCUTANEOUS
  Filled 2012-09-05: qty 30

## 2012-09-05 MED ORDER — OXYCODONE-ACETAMINOPHEN 5-325 MG PO TABS: 1.0000 | ORAL_TABLET | ORAL | Status: DC | PRN

## 2012-09-05 MED ORDER — CITRIC ACID-SODIUM CITRATE 334-500 MG/5ML PO SOLN: 30.0000 mL | ORAL | Status: DC | PRN

## 2012-09-05 MED ORDER — FLEET ENEMA 7-19 GM/118ML RE ENEM: 1.0000 | ENEMA | RECTAL | Status: DC | PRN

## 2012-09-05 MED ORDER — IBUPROFEN 600 MG PO TABS: 600.0000 mg | ORAL_TABLET | Freq: Four times a day (QID) | ORAL | Status: DC | PRN

## 2012-09-05 MED ORDER — OXYTOCIN 40 UNITS IN LACTATED RINGERS INFUSION - SIMPLE MED: 62.5000 mL/h | INTRAVENOUS | Status: DC

## 2012-09-05 MED ORDER — ONDANSETRON HCL 4 MG/2ML IJ SOLN: 4.0000 mg | Freq: Four times a day (QID) | INTRAMUSCULAR | Status: DC | PRN

## 2012-09-05 MED ORDER — OXYTOCIN 40 UNITS IN LACTATED RINGERS INFUSION - SIMPLE MED: 1.0000 m[IU]/min | INTRAVENOUS | Status: DC

## 2012-09-05 MED ORDER — TERBUTALINE SULFATE 1 MG/ML IJ SOLN: 0.2500 mg | Freq: Once | INTRAMUSCULAR | Status: AC | PRN

## 2012-09-05 MED ORDER — OXYTOCIN BOLUS FROM INFUSION
500.0000 mL | INTRAVENOUS | Status: DC
  Administered 2012-09-06: 500 mL via INTRAVENOUS

## 2012-09-05 MED ORDER — LACTATED RINGERS IV SOLN
INTRAVENOUS | Status: DC
  Administered 2012-09-05 (×2): via INTRAVENOUS
  Administered 2012-09-06 (×2): 125 mL/h via INTRAVENOUS
  Administered 2012-09-06: 07:00:00 via INTRAVENOUS

## 2012-09-05 MED ORDER — ACETAMINOPHEN 325 MG PO TABS: 650.0000 mg | ORAL_TABLET | ORAL | Status: DC | PRN

## 2012-09-05 MED ORDER — LACTATED RINGERS IV SOLN: 500.0000 mL | INTRAVENOUS | Status: DC | PRN

## 2012-09-05 NOTE — H&P (Signed)
I have seen and examined patient and agree with above. I reviewed patient's history, including prenatal data and recent ultrasound. AFI 4, largest pocket 2.87 cm, EFW 77%. I reviewed the FHT strip and it is reactive. Pt had SVD in 2005 with >8lb baby, then c-section for twins in 2007. TOLAC consent is signed and scanned in her electronic medical record. Will start with foley bulb as patient is only 1 cm and long. Will start pitocin when FB is out. Monitor sugars q 4 hours while NPO. Hold glyburide.  Napoleon Form, MD 09/05/2012 5:56 PM

## 2012-09-05 NOTE — H&P (Signed)
HPI: [name] [name] is a 27 y.o. year old G28P2003 female at [redacted]w[redacted]d weeks gestation by LMP who presents for Oligohydramnios and IOL for A2GDM.  Pt has been followed by [facility] up until today and went for [place] today showing possible oligohydramnios.  + for contractions starting early this afternoon and irregular , no leakage of fluid, no vaginal bleeding, good fetal movement.    Pt had one previous LTCS due to twins and wants VBAC and has signed the papers.  She denies any HA, blurred vision, edema, worsening abdominal pain.    + for mucousy discharge around two weeks ago    Maternal Medical History:  Contractions: Onset was 6-12 hours ago.   Frequency: irregular.   Perceived severity is mild.    Prenatal complications: Oligohydramnios.   No bleeding, infection or preterm labor.   Prenatal Complications - Diabetes: gestational. Diabetes is managed by oral agent (monotherapy).      OB History    Grav Para Term Preterm Abortions TAB SAB Ect Mult Living   3 2 2  0 0 0 0 0 1 3     Past Medical History  Diagnosis Date  . Complication of anesthesia     hyperventilated "went to sleep"  . Gestational diabetes    Past Surgical History  Procedure Date  . Cesarean section    Family History: family history is not on file. Social History:  reports that she has never smoked. She has never used smokeless tobacco. She reports that she does not drink alcohol or use illicit drugs.   Prenatal Transfer Tool  Maternal Diabetes: Yes:  Diabetes Type:  Insulin/Medication controlled Genetic Screening: Normal Maternal Ultrasounds/Referrals: Normal Fetal Ultrasounds or other Referrals:  None Maternal Substance Abuse:  No Significant Maternal Medications:  None Significant Maternal Lab Results:  None Other Comments:  GBS negative  ROS - Per HPI  Dilation: 1 Effacement (%): Thick Station: -2 Exam by:: Valarie Farace Blood pressure 110/67, pulse 68, height 5\' 2"  (1.575 m), weight 99.791 kg (220 lb), last  menstrual period 12/14/2011. Maternal Exam:  Uterine Assessment: Contraction strength is mild.  Contraction frequency is irregular.   Abdomen: Fetal presentation: vertex  Cervix: Cervix evaluated by digital exam.     Physical Exam  Constitutional: She appears well-developed and well-nourished.  Eyes: EOM are normal.  Cardiovascular: Normal rate and regular rhythm.   Respiratory: Effort normal and breath sounds normal.  GI: Soft. There is no tenderness.  Skin: Skin is warm and dry.    Prenatal labs: ABO, Rh: A/POS/-- (07/01 1333) Antibody: NEG (07/01 1333) Rubella: 32.5 (07/01 1333) RPR: NON REAC (11/04 1518)  HBsAg: NEGATIVE (07/01 1333)  HIV: NON REACTIVE (11/04 1518)  GBS: Negative (12/30 0000)   Assessment/Plan: 1) Induction of Labor due to A2GDM and possible oligohydramnios - Will place normal admission orders  - Foley Bulb placed as pt is 1cm/long/-2  - Epidural if desired  - Will start Pitocin when foley bulb is expunged  - CBG x 2 for BS.  Will hold Glyburide as pt is liquid diet  - Continue monitor cervical checks q2-3 hrs   [name] [name] R. Paulina Fusi, DO of [facility] 09/05/2012, 5:43 PM

## 2012-09-05 NOTE — Progress Notes (Signed)
P = 61   Pt has [place] growth today after NST.   Pt states she signed VBAC form 4 weeks ago but not found under media tab- form signed today.  Pt desires VBAC, IOL scheduled 09/13/12 @ 0700

## 2012-09-05 NOTE — Progress Notes (Signed)
NST reactive 09/05/12

## 2012-09-06 ENCOUNTER — Encounter (HOSPITAL_COMMUNITY): Payer: Self-pay | Admitting: Anesthesiology

## 2012-09-06 ENCOUNTER — Inpatient Hospital Stay (HOSPITAL_COMMUNITY): Payer: Medicaid Other | Admitting: Anesthesiology

## 2012-09-06 ENCOUNTER — Encounter (HOSPITAL_COMMUNITY): Payer: Self-pay | Admitting: *Deleted

## 2012-09-06 DIAGNOSIS — O34219 Maternal care for unspecified type scar from previous cesarean delivery: Secondary | ICD-10-CM

## 2012-09-06 DIAGNOSIS — O4100X0 Oligohydramnios, unspecified trimester, not applicable or unspecified: Secondary | ICD-10-CM

## 2012-09-06 DIAGNOSIS — O99814 Abnormal glucose complicating childbirth: Secondary | ICD-10-CM

## 2012-09-06 LAB — RPR: RPR Ser Ql: NONREACTIVE

## 2012-09-06 MED ORDER — TETANUS-DIPHTH-ACELL PERTUSSIS 5-2.5-18.5 LF-MCG/0.5 IM SUSP: 0.5000 mL | Freq: Once | INTRAMUSCULAR | Status: DC

## 2012-09-06 MED ORDER — FLEET ENEMA 7-19 GM/118ML RE ENEM: 1.0000 | ENEMA | Freq: Every day | RECTAL | Status: DC | PRN

## 2012-09-06 MED ORDER — BENZOCAINE-MENTHOL 20-0.5 % EX AERO
1.0000 "application " | INHALATION_SPRAY | CUTANEOUS | Status: DC | PRN
  Administered 2012-09-07: 1 via TOPICAL
  Filled 2012-09-06: qty 56

## 2012-09-06 MED ORDER — SODIUM CHLORIDE 0.9 % IJ SOLN: 3.0000 mL | Freq: Two times a day (BID) | INTRAMUSCULAR | Status: DC

## 2012-09-06 MED ORDER — EPHEDRINE 5 MG/ML INJ
10.0000 mg | INTRAVENOUS | Status: DC | PRN
  Filled 2012-09-06: qty 4

## 2012-09-06 MED ORDER — OXYTOCIN 40 UNITS IN LACTATED RINGERS INFUSION - SIMPLE MED: 62.5000 mL/h | INTRAVENOUS | Status: DC | PRN

## 2012-09-06 MED ORDER — PHENYLEPHRINE 40 MCG/ML (10ML) SYRINGE FOR IV PUSH (FOR BLOOD PRESSURE SUPPORT)
80.0000 ug | PREFILLED_SYRINGE | INTRAVENOUS | Status: DC | PRN
  Filled 2012-09-06: qty 5

## 2012-09-06 MED ORDER — DIBUCAINE 1 % RE OINT: 1.0000 "application " | TOPICAL_OINTMENT | RECTAL | Status: DC | PRN

## 2012-09-06 MED ORDER — OXYTOCIN 40 UNITS IN LACTATED RINGERS INFUSION - SIMPLE MED
1.0000 m[IU]/min | INTRAVENOUS | Status: DC
  Administered 2012-09-06: 1 m[IU]/min via INTRAVENOUS
  Filled 2012-09-06: qty 1000

## 2012-09-06 MED ORDER — IBUPROFEN 600 MG PO TABS
600.0000 mg | ORAL_TABLET | Freq: Four times a day (QID) | ORAL | Status: DC
  Administered 2012-09-07 – 2012-09-08 (×6): 600 mg via ORAL
  Filled 2012-09-06 (×5): qty 1

## 2012-09-06 MED ORDER — TERBUTALINE SULFATE 1 MG/ML IJ SOLN: 0.2500 mg | Freq: Once | INTRAMUSCULAR | Status: DC | PRN

## 2012-09-06 MED ORDER — PHENYLEPHRINE 40 MCG/ML (10ML) SYRINGE FOR IV PUSH (FOR BLOOD PRESSURE SUPPORT): 80.0000 ug | PREFILLED_SYRINGE | INTRAVENOUS | Status: DC | PRN

## 2012-09-06 MED ORDER — OXYTOCIN 40 UNITS IN LACTATED RINGERS INFUSION - SIMPLE MED: 1.0000 m[IU]/min | INTRAVENOUS | Status: DC

## 2012-09-06 MED ORDER — SODIUM CHLORIDE 0.9 % IV SOLN: 250.0000 mL | INTRAVENOUS | Status: DC | PRN

## 2012-09-06 MED ORDER — LACTATED RINGERS IV SOLN
500.0000 mL | Freq: Once | INTRAVENOUS | Status: AC
  Administered 2012-09-06: 500 mL via INTRAVENOUS

## 2012-09-06 MED ORDER — ONDANSETRON HCL 4 MG PO TABS: 4.0000 mg | ORAL_TABLET | ORAL | Status: DC | PRN

## 2012-09-06 MED ORDER — ZOLPIDEM TARTRATE 5 MG PO TABS: 5.0000 mg | ORAL_TABLET | Freq: Every evening | ORAL | Status: DC | PRN

## 2012-09-06 MED ORDER — OXYCODONE-ACETAMINOPHEN 5-325 MG PO TABS
1.0000 | ORAL_TABLET | ORAL | Status: DC | PRN
  Administered 2012-09-07: 1 via ORAL
  Filled 2012-09-06: qty 1

## 2012-09-06 MED ORDER — DIPHENHYDRAMINE HCL 25 MG PO CAPS: 25.0000 mg | ORAL_CAPSULE | Freq: Four times a day (QID) | ORAL | Status: DC | PRN

## 2012-09-06 MED ORDER — LIDOCAINE HCL (PF) 1 % IJ SOLN
INTRAMUSCULAR | Status: DC | PRN
  Administered 2012-09-06 (×2): 4 mL

## 2012-09-06 MED ORDER — SENNOSIDES-DOCUSATE SODIUM 8.6-50 MG PO TABS
2.0000 | ORAL_TABLET | Freq: Every day | ORAL | Status: DC
  Administered 2012-09-07: 2 via ORAL

## 2012-09-06 MED ORDER — FENTANYL 2.5 MCG/ML BUPIVACAINE 1/10 % EPIDURAL INFUSION (WH - ANES)
14.0000 mL/h | INTRAMUSCULAR | Status: DC
  Filled 2012-09-06: qty 125

## 2012-09-06 MED ORDER — LANOLIN HYDROUS EX OINT: TOPICAL_OINTMENT | CUTANEOUS | Status: DC | PRN

## 2012-09-06 MED ORDER — WITCH HAZEL-GLYCERIN EX PADS: 1.0000 "application " | MEDICATED_PAD | CUTANEOUS | Status: DC | PRN

## 2012-09-06 MED ORDER — SIMETHICONE 80 MG PO CHEW: 80.0000 mg | CHEWABLE_TABLET | ORAL | Status: DC | PRN

## 2012-09-06 MED ORDER — DIPHENHYDRAMINE HCL 50 MG/ML IJ SOLN: 12.5000 mg | INTRAMUSCULAR | Status: DC | PRN

## 2012-09-06 MED ORDER — SODIUM CHLORIDE 0.9 % IJ SOLN: 3.0000 mL | INTRAMUSCULAR | Status: DC | PRN

## 2012-09-06 MED ORDER — MEASLES, MUMPS & RUBELLA VAC ~~LOC~~ INJ
0.5000 mL | INJECTION | Freq: Once | SUBCUTANEOUS | Status: DC
  Filled 2012-09-06: qty 0.5

## 2012-09-06 MED ORDER — FENTANYL 2.5 MCG/ML BUPIVACAINE 1/10 % EPIDURAL INFUSION (WH - ANES)
INTRAMUSCULAR | Status: DC | PRN
  Administered 2012-09-06: 12 mL/h via EPIDURAL

## 2012-09-06 MED ORDER — EPHEDRINE 5 MG/ML INJ: 10.0000 mg | INTRAVENOUS | Status: DC | PRN

## 2012-09-06 MED ORDER — BISACODYL 10 MG RE SUPP: 10.0000 mg | Freq: Every day | RECTAL | Status: DC | PRN

## 2012-09-06 MED ORDER — FENTANYL CITRATE 0.05 MG/ML IJ SOLN
100.0000 ug | INTRAMUSCULAR | Status: DC | PRN
  Administered 2012-09-06 (×3): 100 ug via INTRAVENOUS
  Filled 2012-09-06 (×3): qty 2

## 2012-09-06 MED ORDER — PRENATAL MULTIVITAMIN CH
1.0000 | ORAL_TABLET | Freq: Every day | ORAL | Status: DC
  Administered 2012-09-08: 1 via ORAL
  Filled 2012-09-06 (×2): qty 1

## 2012-09-06 MED ORDER — ONDANSETRON HCL 4 MG/2ML IJ SOLN: 4.0000 mg | INTRAMUSCULAR | Status: DC | PRN

## 2012-09-06 NOTE — Progress Notes (Signed)
Updated [facility] CNM on SVE and pt urge to push.  Clelia Croft will report to room now.

## 2012-09-06 NOTE — Progress Notes (Signed)
Attestation of Attending Supervision of Resident: Evaluation and management procedures were performed by the Family Medicine Resident under my supervision.  I have reviewed the resident's note and chart, and I agree with the management and plan.  Mayvis Agudelo, MD, FACOG Attending Obstetrician & Gynecologist Faculty Practice, [facility]  

## 2012-09-06 NOTE — Progress Notes (Signed)
Interpreter here anesthesia notified

## 2012-09-06 NOTE — Progress Notes (Signed)
Chantrell Apsey is a 27 y.o. G3P2003 at [redacted]w[redacted]d admitted for induction of labor due to oligohydramnios. Exam in presence of spanish interpreter.  Subjective: Pain w/ uc's, breathing well- requesting more iv pain meds.  Does not want epidural     Objective: BP 123/70  Pulse 50  Temp 98 F (36.7 C) (Oral)  Resp 20  Ht 5\' 2"  (1.575 m)  Wt 99.791 kg (220 lb)  BMI 40.24 kg/m2  LMP 12/14/2011      FHT:  FHR: 135 bpm, variability: moderate,  accelerations:  Present,  decelerations:  Present occ mild variable UC:   regular, every 1.5-3 minutes SVE:   6/80/-2, arom small amount clear fluid  Labs: Lab Results  Component Value Date   WBC 9.3 09/05/2012   HGB 14.0 09/05/2012   HCT 41.2 09/05/2012   MCV 83.2 09/05/2012   PLT 224 09/05/2012    Assessment / Plan: IOL d/t oligohydramnios, protracted active phase, now AROM'd. Pitocin currently at 34mu/min.    Labor: protracted Preeclampsia:  n/a Fetal Wellbeing:  Category II Pain Control:  Fentanyl I/D:  n/a Anticipated MOD:  VBAC  Marge Duncans 09/06/2012, 6:03 PM

## 2012-09-06 NOTE — Anesthesia Preprocedure Evaluation (Signed)
Anesthesia Evaluation  Patient identified by MRN, date of birth, ID band Patient awake    Reviewed: Allergy & Precautions, H&P , Patient's Chart, lab work & pertinent test results  Airway Mallampati: III TM Distance: >3 FB     Dental No notable dental hx. (+) Teeth Intact   Pulmonary neg pulmonary ROS,  breath sounds clear to auscultation  Pulmonary exam normal       Cardiovascular negative cardio ROS  Rhythm:Regular Rate:Normal     Neuro/Psych negative psych ROS   GI/Hepatic negative GI ROS, Neg liver ROS,   Endo/Other  diabetes, Well Controlled, Gestational, Oral Hypoglycemic AgentsMorbid obesityHyperlipidemia  Renal/GU negative Renal ROS  negative genitourinary   Musculoskeletal negative musculoskeletal ROS (+)   Abdominal (+) + obese,   Peds  Hematology negative hematology ROS (+)   Anesthesia Other Findings   Reproductive/Obstetrics (+) Pregnancy Previous C/Section                           Anesthesia Physical Anesthesia Plan  ASA: III  Anesthesia Plan: Spinal   Post-op Pain Management:    Induction:   Airway Management Planned: Natural Airway  Additional Equipment:   Intra-op Plan:   Post-operative Plan:   Informed Consent: I have reviewed the patients History and Physical, chart, labs and discussed the procedure including the risks, benefits and alternatives for the proposed anesthesia with the patient or authorized representative who has indicated his/her understanding and acceptance.   Dental advisory given  Plan Discussed with: Anesthesiologist  Anesthesia Plan Comments:         Anesthesia Quick Evaluation

## 2012-09-06 NOTE — Progress Notes (Signed)
[name] [name] is a 27 y.o. G3P2003 at [redacted]w[redacted]d for IOL 2/2 oligohydramnios.   Subjective: No pain, feels only light contractions, received fentanyl 100 mcg @ 14:22  Objective: Temp:  [98 F (36.7 C)-99.1 F (37.3 C)] 98 F (36.7 C) (01/11 1604) Pulse Rate:  [51-84] 53  (01/11 1632) Resp:  [16-20] 20  (01/11 1632) BP: (96-137)/(35-74) 120/63 mmHg (01/11 1632)  FHT:  FHR: 135 bpm, variability: moderate,  accelerations:  Present,  decelerations:  Absent UC:  Regular every 4 mins SVE:   Dilation: 6.5 Effacement (%): 80 Station: 0 Exam by:: Dr Clinton Sawyer  U/S from 1/10: AFI 4.11cm, EFW 7+7  Labs: Lab Results  Component Value Date   WBC 9.3 09/05/2012   HGB 14.0 09/05/2012   HCT 41.2 09/05/2012   MCV 83.2 09/05/2012   PLT 224 09/05/2012    Assessment / Plan: IOL process - s/p foley bulb Oligo  A2GDM- stable VBAC   - Slow progression of labor, increase pitocin  - Fentanyl PRN - Membranes intact - Patient checked out to [name], CNM who will take over care (please call at [phone])    Si Raider. Clinton Sawyer, MD, MBA 09/06/2012, 4:35 PM Family Medicine Resident, PGY-2 [phone] pager  Attestation of Attending Supervision of Resident: Evaluation and management procedures were performed by the [facility] Medicine Resident under my supervision.  I have reviewed the resident's note and chart, and I agree with the management and plan.  Jaynie Collins, MD, FACOG Attending Obstetrician & Gynecologist Faculty Practice, [facility] [place]

## 2012-09-06 NOTE — Anesthesia Procedure Notes (Signed)
Epidural Patient location during procedure: OB Start time: 09/06/2012 7:20 PM  Staffing Anesthesiologist: Reilley Valentine A. Performed by: anesthesiologist   Preanesthetic Checklist Completed: patient identified, site marked, surgical consent, pre-op evaluation, timeout performed, IV checked, risks and benefits discussed and monitors and equipment checked  Epidural Patient position: sitting Prep: site prepped and draped and DuraPrep Patient monitoring: continuous pulse ox and blood pressure Approach: midline Injection technique: LOR air  Needle:  Needle type: Tuohy  Needle gauge: 17 G Needle length: 9 cm and 9 Needle insertion depth: 5 cm cm Catheter type: closed end flexible Catheter size: 19 Gauge Catheter at skin depth: 10 cm Test dose: negative and Other  Assessment Events: blood not aspirated, injection not painful, no injection resistance, negative IV test and no paresthesia  Additional Notes Patient identified. Risks and benefits discussed including failed block, incomplete  Pain control, post dural puncture headache, nerve damage, paralysis, blood pressure Changes, nausea, vomiting, reactions to medications-both toxic and allergic and post Partum back pain. All questions were answered. Patient expressed understanding and wished to proceed. Sterile technique was used throughout procedure. Epidural site was Dressed with sterile barrier dressing. No paresthesias, signs of intravascular injection Or signs of intrathecal spread were encountered.  Patient was more comfortable after the epidural was dosed. Please see RN's note for documentation of vital signs and FHR which are stable.

## 2012-09-06 NOTE — Progress Notes (Signed)
[name] [name] is a 27 y.o. G3P2003 at [redacted]w[redacted]d for IOL 2/2 oligohydramnios.   Subjective: Mild pain, mild pelvic pressure, would like more pain meds through IV  Objective: Temp:  [98 F (36.7 C)-99.1 F (37.3 C)] 99.1 F (37.3 C) (01/11 1145) Pulse Rate:  [52-84] 52  (01/11 1333) Resp:  [16-20] 16  (01/11 1333) BP: (96-137)/(35-69) 137/56 mmHg (01/11 1333) Weight:  [220 lb (99.791 kg)] 220 lb (99.791 kg) (01/10 1531)  FHT:  FHR: 135 bpm, variability: moderate,  accelerations:  Present,  decelerations:  Absent UC:  Regular every 4 mins SVE:   Dilation: 6.5 Effacement (%): 80 Station: 0 Exam by:: Dr Clinton Sawyer  U/S from 1/10: AFI 4.11cm, EFW 7+7  Labs: Lab Results  Component Value Date   WBC 9.3 09/05/2012   HGB 14.0 09/05/2012   HCT 41.2 09/05/2012   MCV 83.2 09/05/2012   PLT 224 09/05/2012    Assessment / Plan: IOL process - s/p foley bulb Oligo  A2GDM- stable VBAC   - Cont pitocin  - Fentanyl PRN - If no ROM at next check, then artificially rupture    Si Raider. Clinton Sawyer, MD, MBA 09/06/2012, 1:50 PM Family Medicine Resident, PGY-2 [phone] pager

## 2012-09-06 NOTE — Progress Notes (Signed)
Emelda Fear CNM for delivery.

## 2012-09-06 NOTE — Progress Notes (Signed)
Attestation of Attending Supervision of Resident: Evaluation and management procedures were performed by the Family Medicine Resident under my supervision.  I have reviewed the resident's note and chart, and I agree with the management and plan.  Keyvin Rison, MD, FACOG Attending Obstetrician & Gynecologist Faculty Practice, [facility]  

## 2012-09-06 NOTE — Progress Notes (Signed)
[name] [name] is a 27 y.o. G3P2003 at [redacted]w[redacted]d for IOL 2/2 oligohydramnios.   Subjective: Mild pain, mild pelvic pressure, no desire to push  Objective: BP 121/57  Pulse 58  Temp 98.5 F (36.9 C) (Oral)  Resp 20  Ht 5\' 2"  (1.575 m)  Wt 220 lb (99.791 kg)  BMI 40.24 kg/m2  LMP 12/14/2011      FHT:  FHR: 135 bpm, variability: moderate,  accelerations:  Present,  decelerations:  Absent UC:  Regular every 4 mins SVE:   Dilation: 4 Effacement (%): 70 Station: -3 Exam by:: S Grindstaff R   U/S from 1/10: AFI 4.11cm, EFW 7+7  Labs: Lab Results  Component Value Date   WBC 9.3 09/05/2012   HGB 14.0 09/05/2012   HCT 41.2 09/05/2012   MCV 83.2 09/05/2012   PLT 224 09/05/2012    Assessment / Plan: IOL process - s/p foley bulb Oligo  A2GDM- stable VBAC   - Cont pitocin per low dose   Si Raider. Clinton Sawyer, MD, [facility] 09/06/2012, 10:57 AM Family Medicine Resident, PGY-2 [phone] pager

## 2012-09-06 NOTE — Progress Notes (Addendum)
[name] [name] is a 27 y.o. G3P2003 at [redacted]w[redacted]d  Subjective: Feeling ctx but not painful  Objective: BP 104/56  Pulse 84  Temp 98.2 F (36.8 C) (Oral)  Resp 18  Ht 5\' 2"  (1.575 m)  Wt 220 lb (99.791 kg)  BMI 40.24 kg/m2  LMP 12/14/2011      FHT:  FHR: 125 bpm, variability: moderate,  accelerations:  Present,  decelerations:  Absent UC:   irregular, every 2-6 minutes SVE:   Dilation: 4 Effacement (%): 70 Station: -3 Exam by:: [name] Lamon RN- exam deferred at present  U/S from 1/10: AFI 4.11cm, EFW 7+7 Labs: Lab Results  Component Value Date   WBC 9.3 09/05/2012   HGB 14.0 09/05/2012   HCT 41.2 09/05/2012   MCV 83.2 09/05/2012   PLT 224 09/05/2012    Assessment / Plan: IOL process Oligo  A2GDM- stable VBAC  - Light breakfast - Pitocin per low dose   [name] [name] 09/06/2012, 7:28 AM

## 2012-09-07 LAB — CBC
Platelets: 191 10*3/uL (ref 150–400)
RDW: 14.2 % (ref 11.5–15.5)
WBC: 15.3 10*3/uL — ABNORMAL HIGH (ref 4.0–10.5)

## 2012-09-07 NOTE — Progress Notes (Signed)
Post Partum Day 1 Subjective: no complaints, up ad lib, voiding and tolerating PO  Objective: Blood pressure 111/62, pulse 74, temperature 97.9 F (36.6 C), temperature source Oral, resp. rate 18, height 5\' 2"  (1.575 m), weight 99.791 kg (220 lb), last menstrual period 12/14/2011, SpO2 98.00%, unknown if currently breastfeeding.  Physical Exam:  General: alert, cooperative and no distress Lochia: appropriate Uterine Fundus: firm Incision: n/a DVT Evaluation: No evidence of DVT seen on physical exam. Negative Homan's sign. No cords or calf tenderness. No significant calf/ankle edema.   Basename 09/05/12 1530  HGB 14.0  HCT 41.2    Assessment/Plan: Plan for discharge tomorrow, Breastfeeding, Lactation consult and Contraception undecided   LOS: 2 days   Marge Duncans 09/07/2012, 6:56 AM

## 2012-09-07 NOTE — Anesthesia Postprocedure Evaluation (Signed)
  Anesthesia Post-op Note  Patient: [name] [name]  Procedure(s) Performed: * No procedures listed *  Patient Location: Mother/Baby  Anesthesia Type:Epidural  Level of Consciousness: awake, alert  and oriented  Airway and Oxygen Therapy: Patient Spontanous Breathing  Post-op Pain: mild  Post-op Assessment: Patient's Cardiovascular Status Stable, Respiratory Function Stable, No headache, No backache, No residual numbness and No residual motor weakness  Post-op Vital Signs: stable  Complications: No apparent anesthesia complications

## 2012-09-08 ENCOUNTER — Other Ambulatory Visit: Payer: Self-pay

## 2012-09-08 NOTE — Progress Notes (Signed)
Post Partum Day 2 Subjective: no complaints, up ad lib, voiding, tolerating PO and + flatus  Objective: Blood pressure 112/67, pulse 62, temperature 97.7 F (36.5 C), temperature source Oral, resp. rate 18, height 5\' 2"  (1.575 m), weight 220 lb (99.791 kg), last menstrual period 12/14/2011, SpO2 98.00%, unknown if currently breastfeeding.  Physical Exam:  General: alert, cooperative and no distress Lochia: appropriate Uterine Fundus: firm Incision: healing well DVT Evaluation: No evidence of DVT seen on physical exam.   Basename 09/07/12 0555 09/05/12 1530  HGB 10.7* 14.0  HCT 31.2* 41.2    Assessment/Plan: Discharge home and Breastfeeding Contraception:  Depo   LOS: 3 days   Henningsgaard, Bradley 09/08/2012, 7:41 AM    I have seen the patient with the PA student and agree with the above.

## 2012-09-08 NOTE — Progress Notes (Signed)
Ur chart review completed.  

## 2012-09-08 NOTE — Discharge Summary (Signed)
  Post Partum Day 2 Subjective: no complaints, up ad lib, voiding, tolerating PO and + flatus  Objective: Blood pressure 119/74, pulse 78, temperature 97.7 F (36.5 C), temperature source Oral, resp. rate 18, height 5\' 6"  (1.676 m), weight 194 lb (87.998 kg), SpO2 100.00%, unknown if currently breastfeeding.  Physical Exam:  General: alert, cooperative and no distress Lochia: appropriate Uterine Fundus: firm Incision:none DVT Evaluation: No evidence of DVT seen on physical exam.   Basename 09/05/12 1950  HGB 10.4*  HCT 31.8*    Assessment/Plan: Discharge home and Breastfeeding, possible tubal ligation or Mirena   LOS: 2 days   Henningsgaard, Bradley 09/08/2012, 7:37 AM   I have seen the patient and agree with the above.

## 2012-09-09 ENCOUNTER — Encounter: Payer: Self-pay | Admitting: *Deleted

## 2012-09-09 LAB — TYPE AND SCREEN
ABO/RH(D): A POS
Unit division: 0

## 2012-09-11 ENCOUNTER — Other Ambulatory Visit: Payer: Self-pay

## 2012-09-13 ENCOUNTER — Inpatient Hospital Stay (HOSPITAL_COMMUNITY): Payer: Self-pay

## 2012-09-13 NOTE — Progress Notes (Signed)
NST reactive on 07/2712

## 2012-10-08 ENCOUNTER — Ambulatory Visit: Payer: Self-pay | Admitting: Family Medicine

## 2013-01-22 NOTE — Telephone Encounter (Signed)
Opened in error

## 2013-01-26 IMAGING — US US OB DETAIL+14 WK
1 series · 12 of 28 positions shown · non-contrast
Comparison: none

[Series 1: us ob detail +14 wk · 12 of 80 slices shown]
[im 3/80]
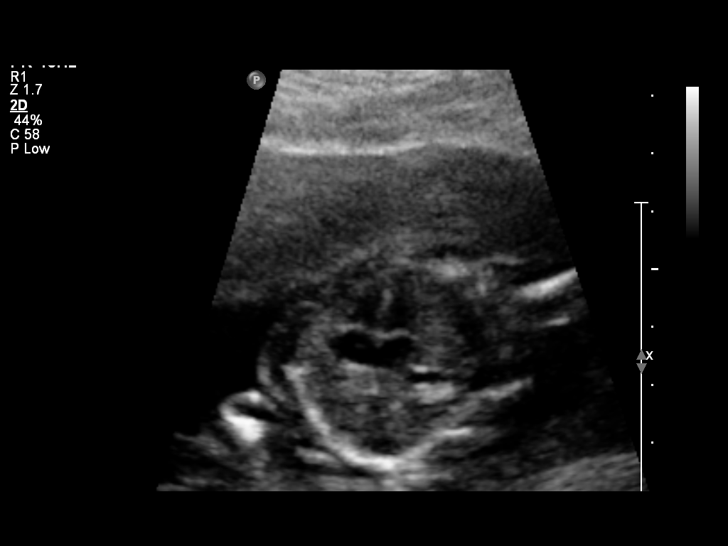
[im 9/80]
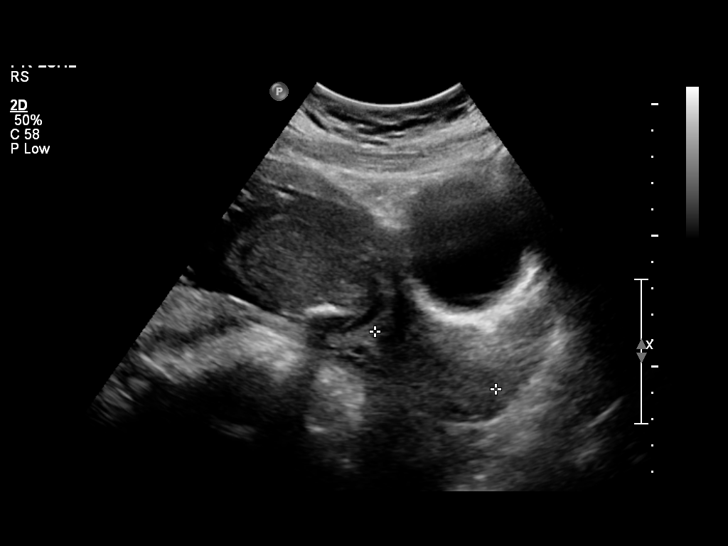
[im 15/80]
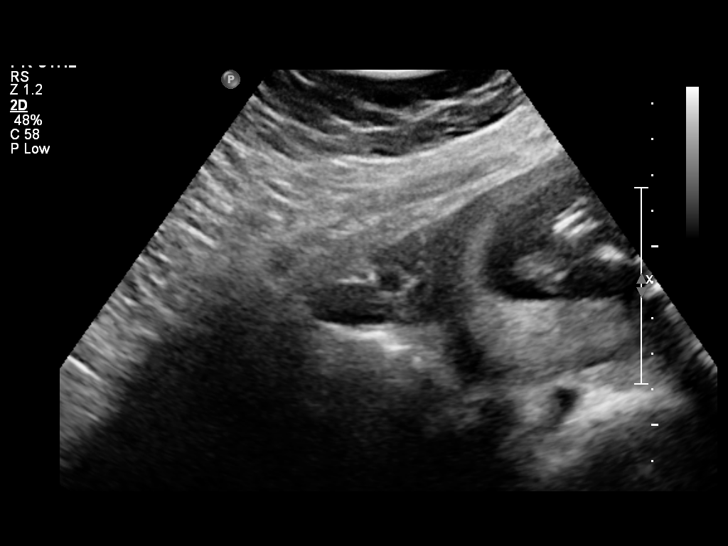
[im 24/80]
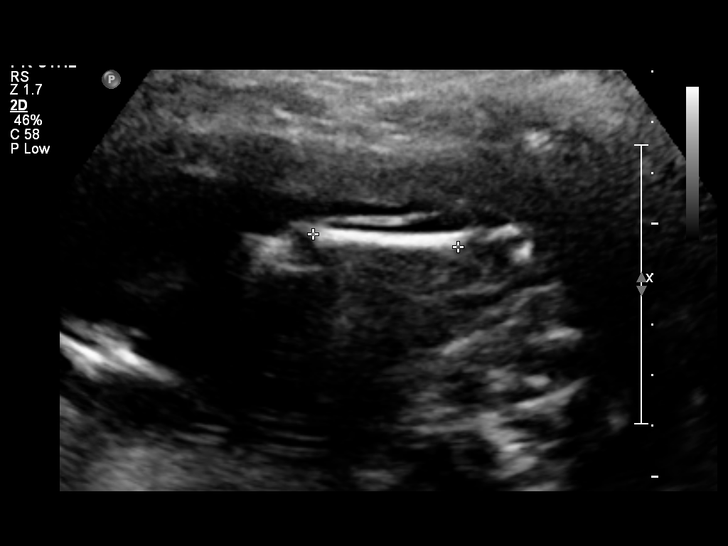
[im 30/80]
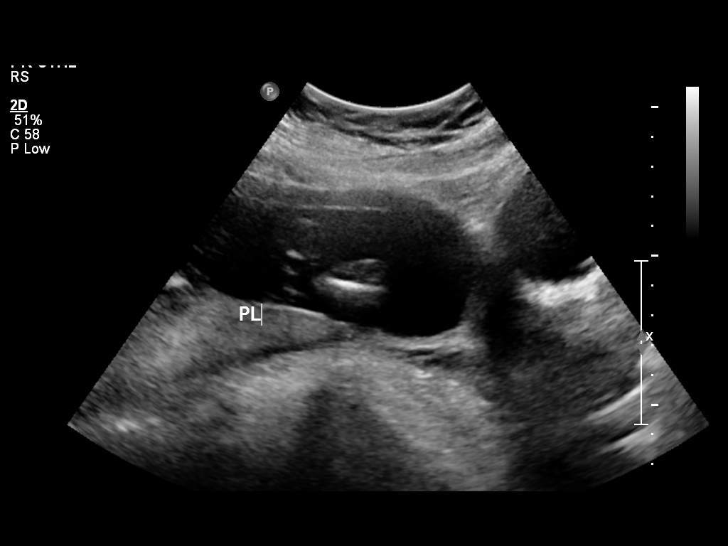
[im 36/80]
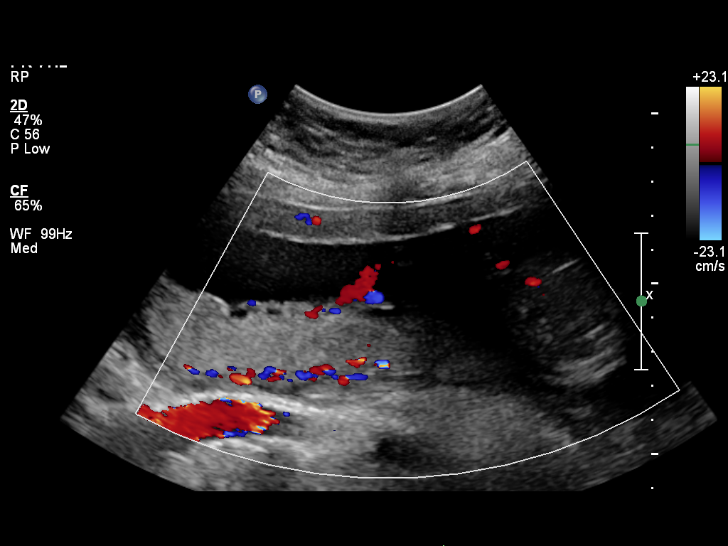
[im 44/80]
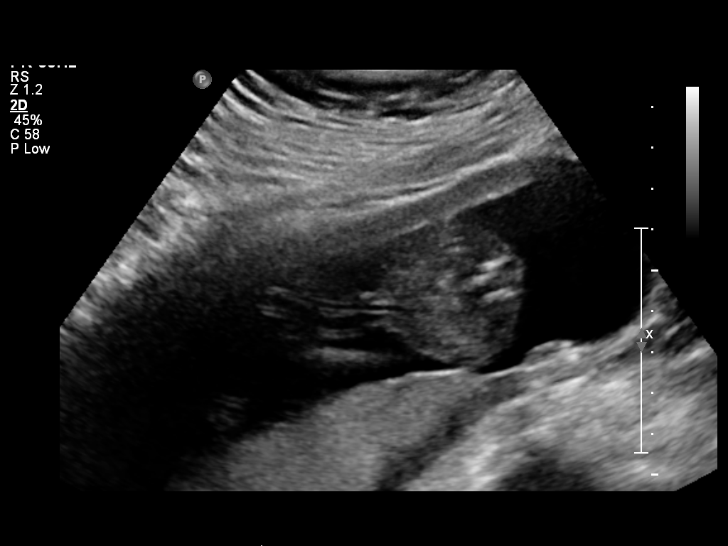
[im 50/80]
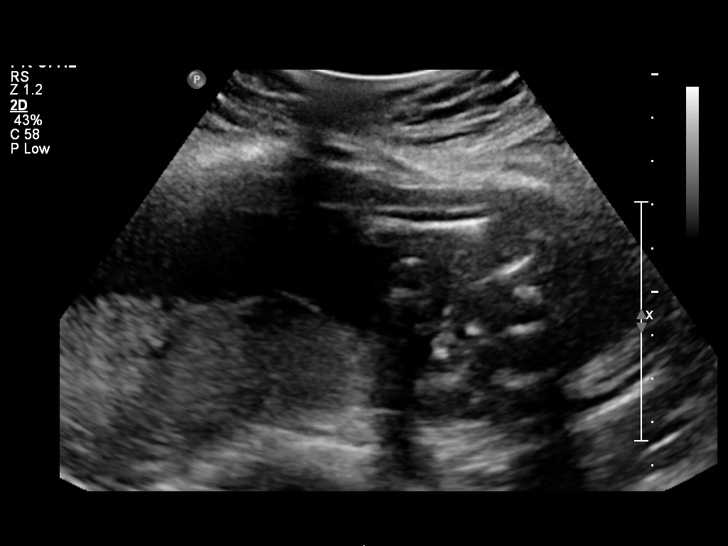
[im 56/80]
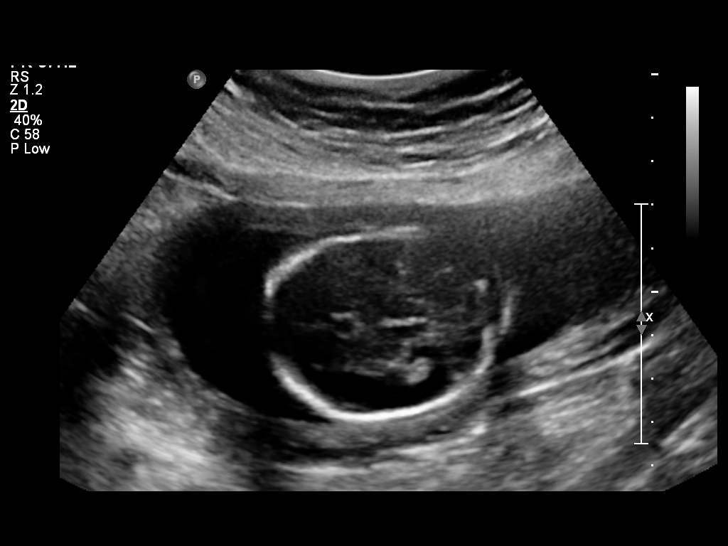
[im 65/80]
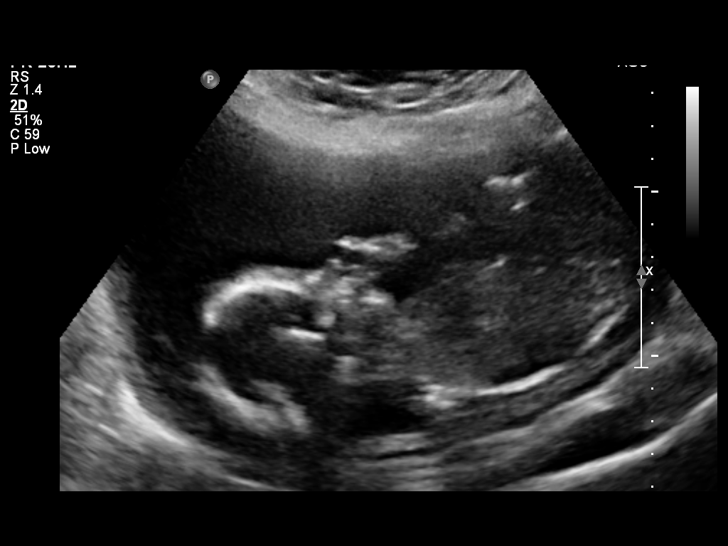
[im 71/80]
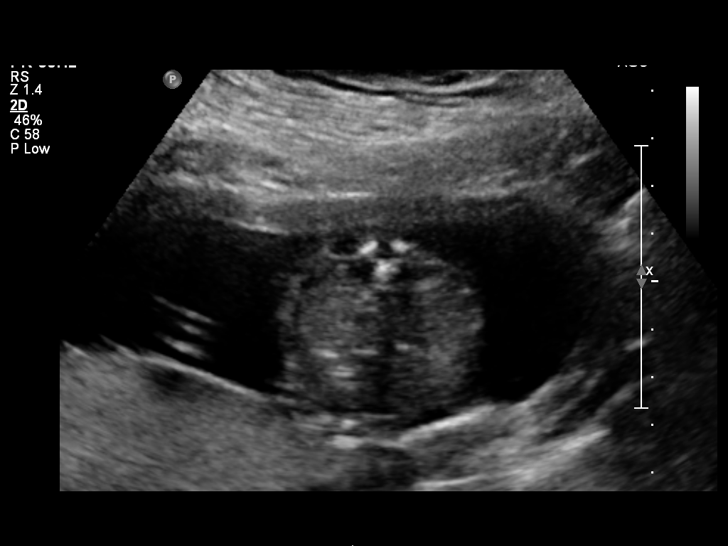
[im 77/80]
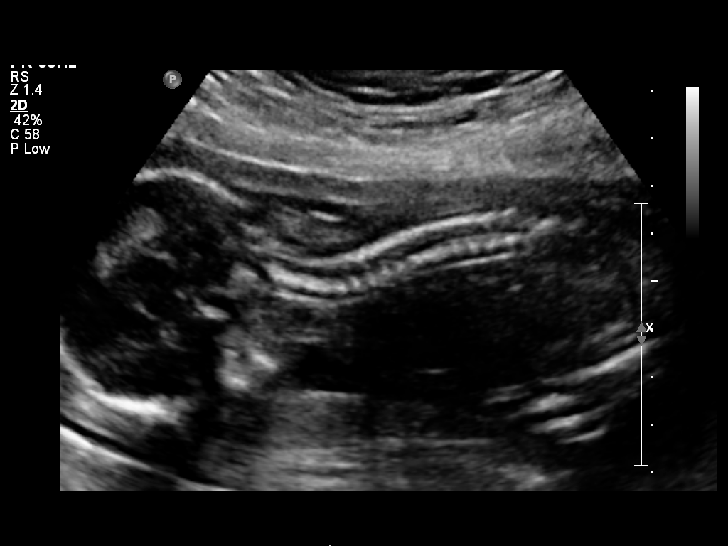

[12 of 28 positions shown; findings below may reference images not displayed]

OBSTETRICS REPORT
                      (Signed Final 04/23/2012 [DATE])

          WIESSE

 Order#:         [id number]
Procedures

 US OB DETAIL + 14 WK                                  76811.0
Indications

 Diabetes - Gestational
 Obesity
 Detailed fetal anatomic survey
Fetal Evaluation

 Fetal Heart Rate:  146                         bpm
 Cardiac Activity:  Observed
 Presentation:      Breech
 Placenta:          Posterior, above cervical
                    os
 P. Cord            Visualized
 Insertion:

 Amniotic Fluid
 AFI FV:      Subjectively within normal limits
                                             Larg Pckt:     4.0  cm
Biometry

 BPD:     40.6  mm    G. Age:   18w 2d                CI:        67.91   70 - 86
                                                      FL/HC:      16.3   16.1 -

 HC:     157.6  mm    G. Age:   18w 5d       40  %    HC/AC:      1.14   1.09 -

 AC:     138.6  mm    G. Age:   19w 2d       65  %    FL/BPD:
 FL:      25.7  mm    G. Age:   17w 6d       16  %    FL/AC:      18.5   20 - 24
 HUM:     28.3  mm    G. Age:   19w 1d       63  %
 NFT:      3.7  mm

 Est. FW:     247  gm      0 lb 9 oz     43  %
Gestational Age

 LMP:           18w 5d       Date:   12/14/11                 EDD:   09/19/12
 U/S Today:     18w 4d                                        EDD:   09/20/12
 Best:          18w 5d    Det. By:   LMP  (12/14/11)          EDD:   09/19/12
Anatomy

 Cranium:           Appears normal      Aortic Arch:       Appears normal
 Fetal Cavum:       Appears normal      Ductal Arch:       Not well
                                                           visualized
 Ventricles:        Appears normal      Diaphragm:         Appears normal
 Choroid Plexus:    Appears normal      Stomach:           Appears
                                                           normal, left
                                                           sided
 Cerebellum:        Appears normal      Abdomen:           Appears normal
 Posterior Fossa:   Appears normal      Abdominal Wall:    Appears nml
                                                           (cord insert,
                                                           abd wall)
 Nuchal Fold:       Appears normal      Cord Vessels:      Appears normal
                    (neck, nuchal                          (3 vessel cord)
                    fold)
 Face:              Orbits appear       Kidneys:           Appear normal
                    normal
 Heart:             Appears normal      Bladder:           Appears normal
                    (4 chamber &
                    axis)
 RVOT:              Appears normal      Spine:             Appears normal
 LVOT:              Appears normal      Limbs:             Four extremities
                                                           seen

 Other:     Technically difficult due to maternal habitus and fetal
            position.
Cervix Uterus Adnexa

 Cervical Length:   4.2       cm

 Cervix:       Closed. Normal appearance by transabdominal scan.

 Left Ovary:   Within normal limits.
 Right Ovary:  Within normal limits.
 Adnexa:     No abnormality visualized.
Impression

 Single live IUP in breech presentation.   Concordant
 measurements/assigned GA by LMP.
 No anatomic abnormality seen with a good quality survey
 possible. (Ductal arch not well visualized due to fetal position.)

## 2013-05-20 IMAGING — US US OB FOLLOW-UP
1 series · 12 of 28 positions shown · non-contrast
Comparison: none

[Series 1: us ob follow up · 12 of 37 slices shown]
[im 2/37]
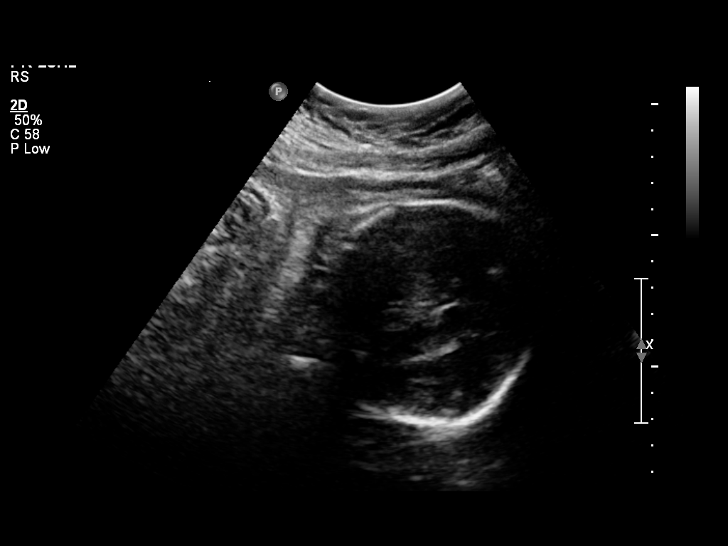
[im 5/37]
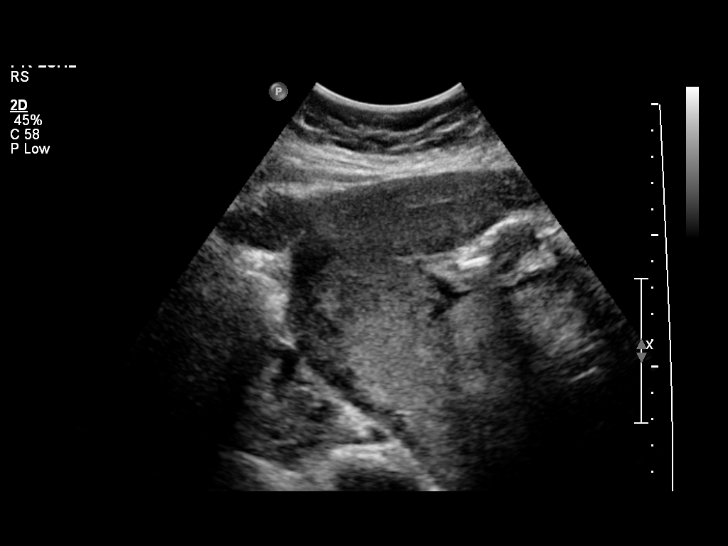
[im 7/37]
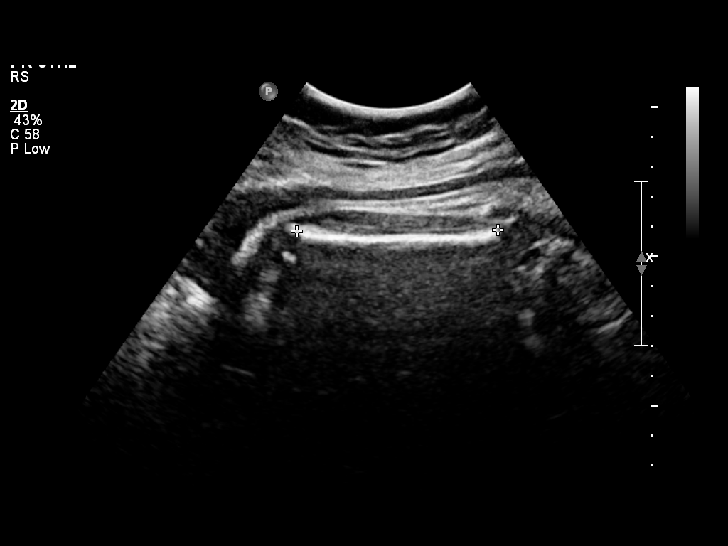
[im 11/37]
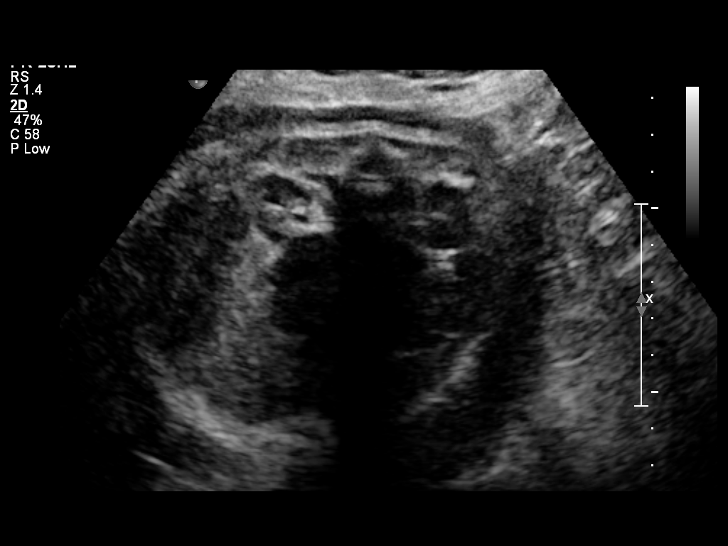
[im 14/37]
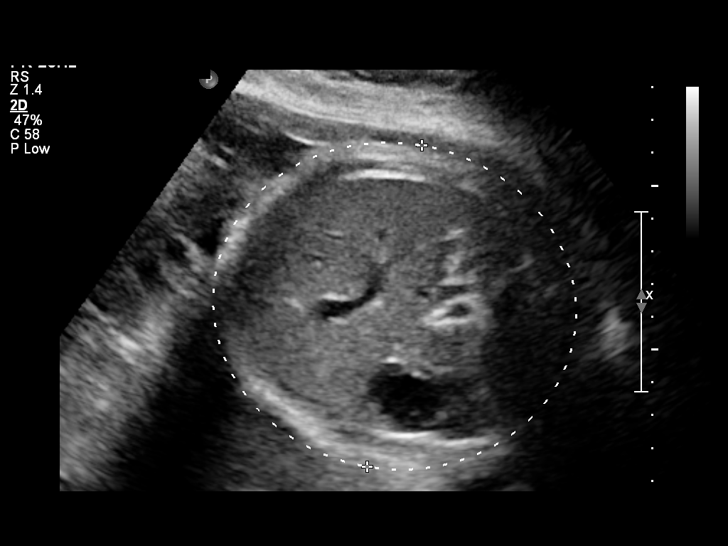
[im 17/37]
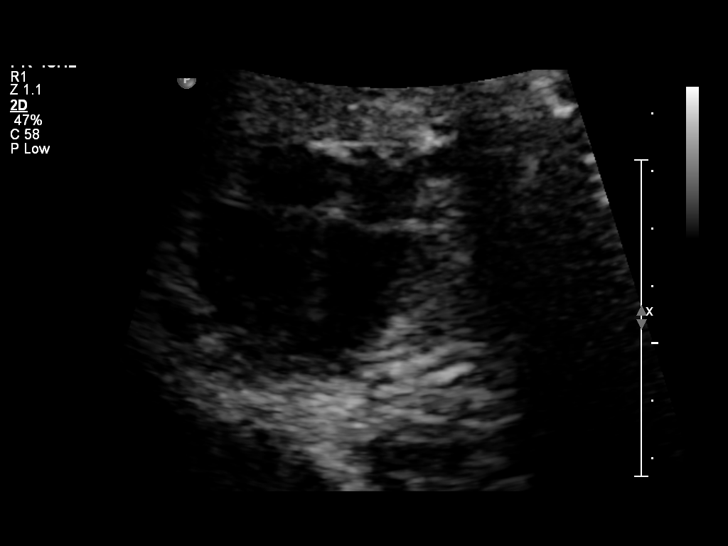
[im 21/37]
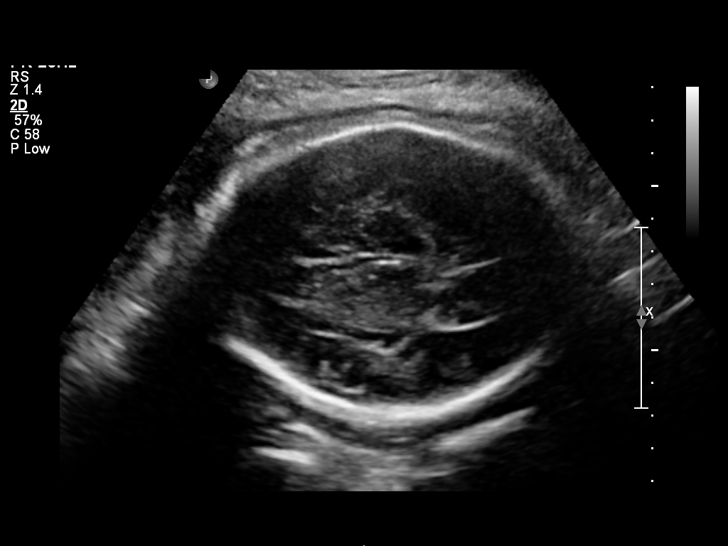
[im 23/37]
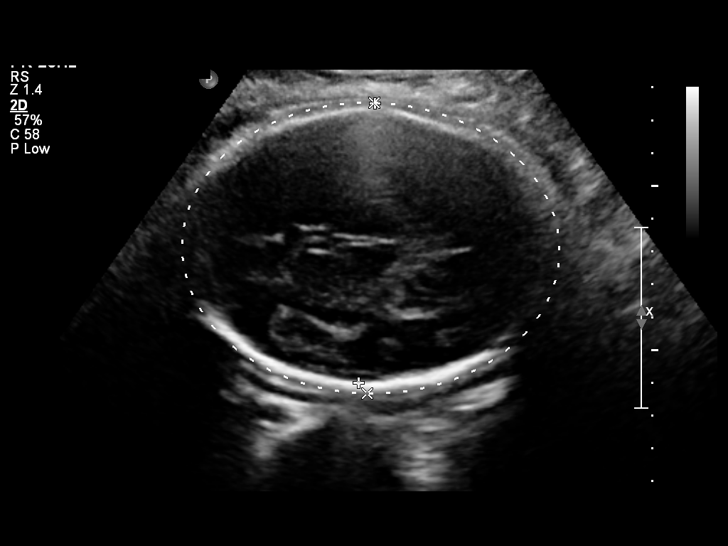
[im 26/37]
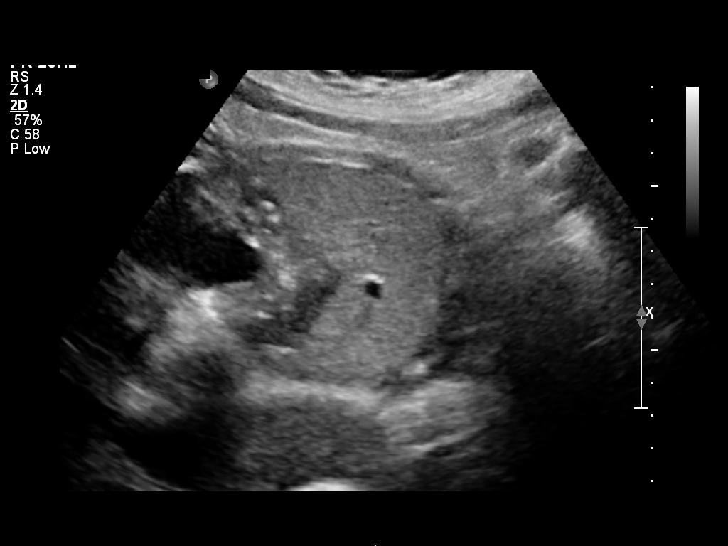
[im 30/37]
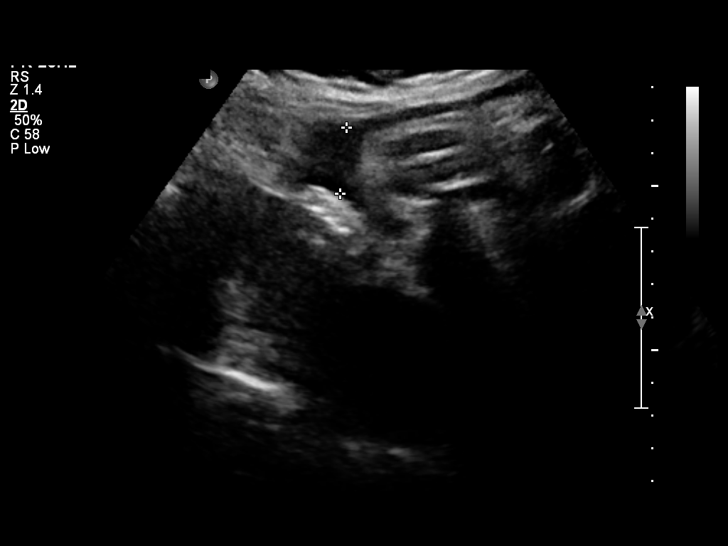
[im 33/37]
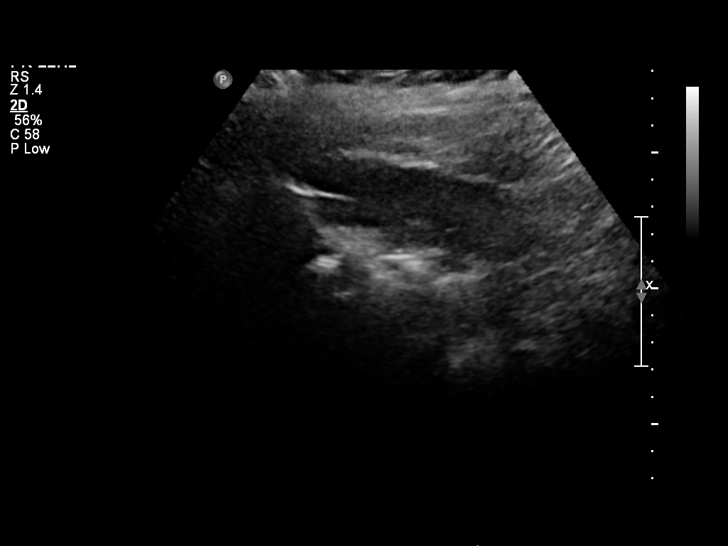
[im 35/37]
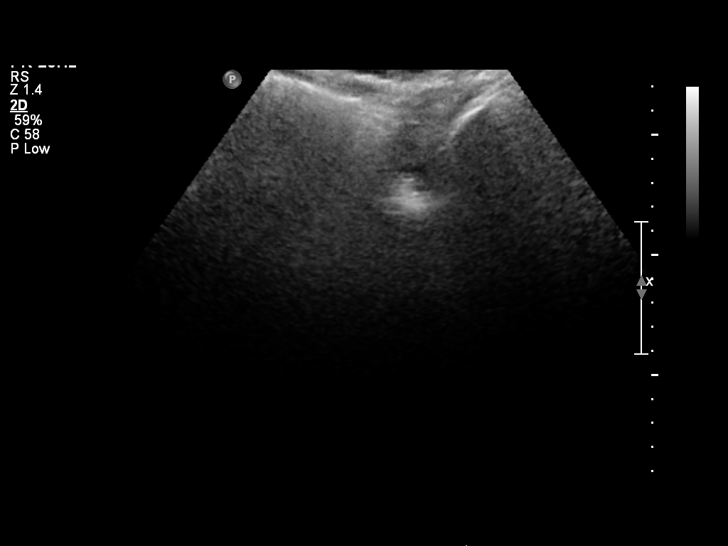

[12 of 28 positions shown; findings below may reference images not displayed]

OBSTETRICS REPORT
                      (Signed Final 08/15/2012 [DATE])

             BAINOR

Service(s) Provided

 US OB FOLLOW UP                                       76816.1
Indications

 Diabetes - Gestational
 Previous cesarean section
 Obesity
Fetal Evaluation

 Num Of Fetuses:    1
 Fetal Heart Rate:  144                         bpm
 Cardiac Activity:  Observed
 Presentation:      Cephalic
 Placenta:          Posterior, above cervical
                    os
 P. Cord            Previously Visualized
 Insertion:

 Amniotic Fluid
 AFI FV:      Subjectively low-normal
 AFI Sum:     9.71    cm      18   %Tile     Larg Pckt:   4.22   cm
 RUQ:   4.22   cm    RLQ:    3.44   cm    LUQ:   2.02    cm   LLQ:    0.03   cm
Biometry

 BPD:     84.6  mm    G. Age:   34w 0d                CI:        69.84   70 - 86
                                                      FL/HC:      20.8   20.1 -

 HC:       323  mm    G. Age:   36w 4d       54  %    HC/AC:      0.98   0.93 -

 AC:     329.5  mm    G. Age:   36w 6d       94  %    FL/BPD:     79.3   71 - 87
 FL:      67.1  mm    G. Age:   34w 4d       30  %    FL/AC:      20.4   20 - 24

 Est. FW:    8528  gm      6 lb 3 oz     78  %
Gestational Age

 LMP:           35w 0d       Date:   12/14/11                 EDD:   09/19/12
 U/S Today:     35w 4d                                        EDD:   09/15/12
 Best:          35w 0d    Det. By:   LMP  (12/14/11)          EDD:   09/19/12
Anatomy
 Cranium:          Appears normal         Ductal Arch:      Not well visualized
 Fetal Cavum:      Appears normal         Diaphragm:        Appears normal
 Ventricles:       Appears normal         Stomach:          Appears normal, left
                                                            sided
 Choroid Plexus:   Previously seen        Abdomen:          Appears normal
 Cerebellum:       Previously seen        Abdominal Wall:   Previously seen
 Posterior Fossa:  Previously seen        Cord Vessels:     Previously seen
 Nuchal Fold:      Previously seen        Kidneys:          Appear normal
 Face:             Orbits previously      Bladder:          Appears normal
                   seen
 Lips:             Previously seen        Spine:            Previously seen
 Heart:            Appears normal (4      Lower             Previously seen
                   chamber & axis)        Extremities:
 RVOT:             Previously seen        Upper             Previously seen
                                          Extremities:
 LVOT:             Previously seen        Limbs:            Four extremities
                                                            seen
 Aortic Arch:      Previously seen

 Other:  Technically difficult due to maternal habitus and fetal position.
Cervix Uterus Adnexa

 Cervix:       Not visualized (advanced GA >34 wks)

 Adnexa:     No abnormality visualized.
Impression

 Single live IUP in cephalic presentation.  Concordant
 measurements/assigned GA by LMP.   EFW 78%ile.
 No late-developing anomaly in visualized structures above.

## 2014-06-28 ENCOUNTER — Encounter (HOSPITAL_COMMUNITY): Payer: Self-pay | Admitting: *Deleted

## 2015-02-25 DIAGNOSIS — E1165 Type 2 diabetes mellitus with hyperglycemia: Secondary | ICD-10-CM

## 2015-02-25 HISTORY — DX: Type 2 diabetes mellitus with hyperglycemia: E11.65

## 2015-09-15 ENCOUNTER — Encounter: Payer: Self-pay | Admitting: Internal Medicine

## 2015-10-06 ENCOUNTER — Encounter: Payer: Self-pay | Admitting: Internal Medicine

## 2015-10-06 ENCOUNTER — Ambulatory Visit (INDEPENDENT_AMBULATORY_CARE_PROVIDER_SITE_OTHER): Payer: Self-pay | Admitting: Internal Medicine

## 2015-10-06 VITALS — BP 124/76 | HR 78 | Ht 62.0 in | Wt 212.5 lb

## 2015-10-06 DIAGNOSIS — Z23 Encounter for immunization: Secondary | ICD-10-CM

## 2015-10-06 DIAGNOSIS — E119 Type 2 diabetes mellitus without complications: Secondary | ICD-10-CM

## 2015-10-06 DIAGNOSIS — K0889 Other specified disorders of teeth and supporting structures: Secondary | ICD-10-CM

## 2015-10-06 DIAGNOSIS — Z124 Encounter for screening for malignant neoplasm of cervix: Secondary | ICD-10-CM

## 2015-10-06 DIAGNOSIS — Z0000 Encounter for general adult medical examination without abnormal findings: Secondary | ICD-10-CM

## 2015-10-06 LAB — GLUCOSE, POCT (MANUAL RESULT ENTRY): POC Glucose: 129 mg/dl — AB (ref 70–99)

## 2015-10-06 NOTE — Progress Notes (Signed)
Subjective:    Patient ID: [name] [name], female    DOB: Apr 30, 1986, 30 y.o.   MRN: [id number]  HPI  Here for CPE with pap.  DM II:  Fasting blood sugars are 90-100 generally.  And 130-140 in the evening about 30 minutes after eating.  Continues on Metformin.Marland Kitchen  Health Maintenance:    Immunizations:  Tdap in 2013, no influenza yet this year.  No Pneumovax in past  Pap:  Last was 3 years ago.  Always normal. Is at end of period currently.  SBE:  Does perform monthly.  Has some discomfort in right lateral chest and axilla, but only tenderness, does not feel anything abnormal.  PMH:  1.  Diabetes Mellitus 2.  Hypercholesterolemia 3.  Obesity  PSH:  1.  2007:  Cesarean Section, low transverse  Current outpatient prescriptions:  .  etonogestrel (NEXPLANON) 68 MG IMPL implant, 1 each by Subdermal route once., Disp: , Rfl:  .  metFORMIN (GLUCOPHAGE) 500 MG tablet, Take by mouth 2 (two) times daily with a meal., Disp: , Rfl:       Review of Systems  Constitutional: Negative for fatigue.       Has gained 1 lb.   Has not been getting out with the minimal cold.  HENT: Negative for ear pain, hearing loss and sinus pressure.   Eyes: Negative for visual disturbance.  Respiratory: Negative for cough and shortness of breath.   Cardiovascular: Negative for chest pain.  Gastrointestinal: Negative for nausea, abdominal pain, diarrhea, constipation and blood in stool.  Endocrine: Negative for polydipsia and polyphagia.  Genitourinary: Negative for dysuria, vaginal discharge, menstrual problem and pelvic pain.  Musculoskeletal: Negative for arthralgias.  Skin: Negative for rash.       No concerning skin lesions  Neurological: Negative for dizziness and headaches.  Hematological: Negative for adenopathy.  Psychiatric/Behavioral:       No depression or feeling down       Objective:   Physical Exam  Constitutional: She is oriented to person, place, and time. She appears  well-developed and well-nourished.  HENT:  Head: Normocephalic and atraumatic.  Right Ear: Tympanic membrane and external ear normal.  Left Ear: Tympanic membrane and external ear normal.  Nose: Nose normal.  Mouth/Throat: Oropharynx is clear and moist.  Eyes: Conjunctivae and EOM are normal. Pupils are equal, round, and reactive to light.  Discs sharp bilaterally without exudate or hemorrhage noted  Neck: Normal range of motion. Neck supple. No thyromegaly present.  Cardiovascular: Normal rate, regular rhythm, S1 normal and S2 normal.  Exam reveals no S3, no S4 and no friction rub.   No murmur heard. No carotid bruits.  Carotid, radial, femoral, DP and PT pulses normal and equal.    Pulmonary/Chest: Effort normal and breath sounds normal.  Breasts:   without focal mass, skin dimpling, nipple discharge or axillary adenopathy.    Abdominal: Soft. Normal appearance and bowel sounds are normal. She exhibits no mass. There is no hepatosplenomegaly.  Genitourinary:  Genitalia: External genitalia normal without lesion.  No vaginal lesions or discharge. Vaginal mucosa moist. Cervix without lesion.  Pap taken.  No CMT.  No uterine or adnexal mass or tenderness.    Musculoskeletal: Normal range of motion.  Lymphadenopathy:    She has no cervical adenopathy.    She has no axillary adenopathy.       Right: No inguinal adenopathy present.       Left: No inguinal adenopathy present.  Neurological: She is alert  and oriented to person, place, and time. She has normal strength and normal reflexes. She displays normal reflexes. No cranial nerve deficit.  Normal 10 g monofilament examination of feet.  Skin: Skin is warm and dry. No rash noted. No cyanosis. Nails show no clubbing.  Psychiatric: She has a normal mood and affect. Her behavior is normal. Judgment and thought content normal.            Assessment & Plan:  1.  CPE with pap Flu vaccine today. Encouraged Pneumovax at [facility] Return for  fasting labs:  FLP, A1C, CMP, urine microalbumin  2.  DM:  Labs as above.  Ophtho referral  3.  Dental pain:  Patient brings this up at discharge--referral to dental clinic.

## 2015-10-07 ENCOUNTER — Other Ambulatory Visit (INDEPENDENT_AMBULATORY_CARE_PROVIDER_SITE_OTHER): Payer: Self-pay

## 2015-10-07 DIAGNOSIS — E7800 Pure hypercholesterolemia, unspecified: Secondary | ICD-10-CM

## 2015-10-07 DIAGNOSIS — E1165 Type 2 diabetes mellitus with hyperglycemia: Secondary | ICD-10-CM

## 2015-10-07 LAB — CYTOLOGY - PAP

## 2015-10-08 LAB — LIPID PANEL W/O CHOL/HDL RATIO
CHOLESTEROL TOTAL: 202 mg/dL — AB (ref 100–199)
HDL: 47 mg/dL (ref >39)
LDL Calculated: 112 mg/dL — ABNORMAL HIGH (ref 0–99)
TRIGLYCERIDES: 215 mg/dL — AB (ref 0–149)
VLDL Cholesterol Cal: 43 mg/dL — ABNORMAL HIGH (ref 5–40)

## 2015-10-08 LAB — MICROALBUMIN, URINE: Microalbumin, Urine: 27.5 ug/mL

## 2015-10-08 LAB — COMPREHENSIVE METABOLIC PANEL
A/G RATIO: 1.6 (ref 1.1–2.5)
ALBUMIN: 4.2 g/dL (ref 3.5–5.5)
ALK PHOS: 67 IU/L (ref 39–117)
ALT: 103 IU/L — AB (ref 0–32)
AST: 76 IU/L — ABNORMAL HIGH (ref 0–40)
BILIRUBIN TOTAL: 0.5 mg/dL (ref 0.0–1.2)
BUN / CREAT RATIO: 19 (ref 8–20)
BUN: 10 mg/dL (ref 6–20)
CALCIUM: 9.3 mg/dL (ref 8.7–10.2)
CHLORIDE: 100 mmol/L (ref 96–106)
CO2: 23 mmol/L (ref 18–29)
Creatinine, Ser: 0.54 mg/dL — ABNORMAL LOW (ref 0.57–1.00)
GFR calc non Af Amer: 128 mL/min/{1.73_m2} (ref >59)
GFR, EST AFRICAN AMERICAN: 147 mL/min/{1.73_m2} (ref >59)
GLOBULIN, TOTAL: 2.7 g/dL (ref 1.5–4.5)
Glucose: 160 mg/dL — ABNORMAL HIGH (ref 65–99)
POTASSIUM: 4.6 mmol/L (ref 3.5–5.2)
Sodium: 139 mmol/L (ref 134–144)
TOTAL PROTEIN: 6.9 g/dL (ref 6.0–8.5)

## 2015-10-08 LAB — HGB A1C W/O EAG: HEMOGLOBIN A1C: 7.9 % — AB (ref 4.8–5.6)

## 2015-12-12 ENCOUNTER — Other Ambulatory Visit: Payer: No Typology Code available for payment source

## 2015-12-16 ENCOUNTER — Other Ambulatory Visit: Payer: Self-pay | Admitting: Internal Medicine

## 2015-12-16 ENCOUNTER — Telehealth: Payer: Self-pay | Admitting: Internal Medicine

## 2015-12-16 DIAGNOSIS — R748 Abnormal levels of other serum enzymes: Secondary | ICD-10-CM

## 2015-12-16 NOTE — Telephone Encounter (Signed)
Patient came in for blood work today and asked if she was able to get [facility] get [facility]. Patient was referred by Dr. Delrae AlfredMulberry for an eye exam and [facility] she was prescribed the eye glasses. Called [facility] to ask if patient could get the assistance.Yes, patient can get a voucher. [place] she can send out the voucher to the patient by mail.  Patient is informed of all information. [facility]

## 2015-12-16 NOTE — Progress Notes (Signed)
Patient ID: [name] [name], female   DOB: 03/02/1986, 30 y.o.   MRN: [id number] Here for repeat hepatic function panel due to elevated transaminases.   If still elevated check viral hepatitis profile.

## 2015-12-21 NOTE — Addendum Note (Signed)
Addended by: Marcene DuosMULBERRY, Luceil Herrin M on: 12/21/2015 06:29 PM   Modules accepted: Orders

## 2015-12-21 NOTE — Progress Notes (Signed)
Patient ID: [name] [name], female   DOB: 06/27/1986, 30 y.o.   MRN: [id number] Called in labs to add to 12/16/2015 labs:  Hep B Sab, Hep B Core Ab, Hep B S Ag, Hep A Ab total, Hep C Ab

## 2016-01-13 ENCOUNTER — Other Ambulatory Visit: Payer: No Typology Code available for payment source

## 2016-01-20 ENCOUNTER — Ambulatory Visit: Payer: No Typology Code available for payment source | Admitting: Internal Medicine

## 2016-01-27 ENCOUNTER — Other Ambulatory Visit (INDEPENDENT_AMBULATORY_CARE_PROVIDER_SITE_OTHER): Payer: No Typology Code available for payment source

## 2016-01-27 DIAGNOSIS — E119 Type 2 diabetes mellitus without complications: Secondary | ICD-10-CM

## 2016-02-02 ENCOUNTER — Encounter: Payer: Self-pay | Admitting: Internal Medicine

## 2016-02-02 ENCOUNTER — Ambulatory Visit (INDEPENDENT_AMBULATORY_CARE_PROVIDER_SITE_OTHER): Payer: Self-pay | Admitting: Internal Medicine

## 2016-02-02 ENCOUNTER — Ambulatory Visit: Payer: No Typology Code available for payment source | Admitting: Internal Medicine

## 2016-02-02 VITALS — BP 122/76 | HR 70 | Resp 14 | Ht 62.0 in | Wt 210.0 lb

## 2016-02-02 DIAGNOSIS — E119 Type 2 diabetes mellitus without complications: Secondary | ICD-10-CM

## 2016-02-02 DIAGNOSIS — E785 Hyperlipidemia, unspecified: Secondary | ICD-10-CM

## 2016-02-02 DIAGNOSIS — K760 Fatty (change of) liver, not elsewhere classified: Secondary | ICD-10-CM

## 2016-02-02 DIAGNOSIS — R748 Abnormal levels of other serum enzymes: Secondary | ICD-10-CM

## 2016-02-02 DIAGNOSIS — Z23 Encounter for immunization: Secondary | ICD-10-CM

## 2016-02-02 LAB — GLUCOSE, POCT (MANUAL RESULT ENTRY): POC GLUCOSE: 109 mg/dL — AB (ref 70–99)

## 2016-02-02 MED ORDER — METFORMIN HCL 500 MG PO TABS: ORAL_TABLET | ORAL | Status: DC

## 2016-02-02 NOTE — Progress Notes (Signed)
   Subjective:    Patient ID: [name] SchneidersEstela Parada-Carreno, female    DOB: 12/08/1985, 30 y.o.   MRN: [id number]  HPI  1.  Elevated Liver Enzymes, ALT higher than AST.  Looking back in chart to 2009, had similar level of elevation with negative Viral hepatitis evaluation--Hep B SAb positive.  Negative Hep C and A.  Pt. Had recheck of Viral Hepatitis, but unable to see those results from April. Have been trying to get results from [facility], as apparently they were done, but no results as of yet.   Pt's liver enzymes resolved to normal levels in 12.2009.  Records in Epic only really go back to 2012 with written record.  Does appear in her lab record, however, that she was placed on medication to lower cholesterol as her cholesterol dropped significantly from July to December and her liver enzymes normalized.   She does recall this and states she was told only to take the cholesterol lowering medication for 3 months.  Has not had an abdominal ultrasound in past--just OB  No symptoms of abdominal pain, nausea, etc.   No alcohol intake. Has started walking again and watching what she eats better recently, now she is back from [place]. She feels motivated to work on diet and physical activity.  2.  DM Type 2:  As above, was eating poorly before returning from [place] 01/26/2016 before having A1C increased to 8.7%.  Has not had Pneumovax yet.  Did have eye exam:  Was told she needs glasses. And received voucher to purchase.    3.  Hyperlipidemia:  As in #1.    Current outpatient prescriptions:  .  etonogestrel (NEXPLANON) 68 MG IMPL implant, 1 each by Subdermal route once., Disp: , Rfl:  .  metFORMIN (GLUCOPHAGE) 500 MG tablet, Take by mouth 2 (two) times daily with a meal., Disp: , Rfl:      Review of Systems     Objective:   Physical Exam Lungs:  CTA CV:  RRR without murmur or rub, radial pulses normal and equal. ABd:  S, NT, No HSM or mass, + BS       Assessment & Plan:  1.  Elevated  Liver Enzymes:  Looking back at 2009 labs, and normalization of liver enzymes with treatment of hyperlipidemia, likely Fatty liver.  Her Viral Hepatitis testing was negative for chronic hepatitis B or C and immune to B.  Pt. Would like to work on diet and physical activity and avoid another medication, such as statin for now.  2.  Fatty Liver:  As above  3.  DM Type 2:  Pneumovax today.  Control significantly worse recently.  As above, would like to work on diet and physical activity without increasing meds at this point.  4.  Hyperlipidemia, mild:  As above with lifestyle changes.  Discussed needs to maintain lifestyle changes, despite time of year/weather.  To have an inclement plan for activity.  Diet discussed at length

## 2016-02-02 NOTE — Patient Instructions (Addendum)
Tome un vaso de agua antes de cada comida Tome un minimo de 6 a 8 vasos de agua diarios Coma tres veces al dia Coma una proteina y Neomia Dearuna grasa saludable con comida.  (huevos, pescado, pollo, pavo, y limite carnes rojas Coma 5 porciones diarias de legumbres.  Mezcle los colores Coma 2 porciones diarias de frutas con cascara cuando sea comestible Use platos pequeos Suelte su tenedor o cuchara despues de cada mordida hata que se mastique y se trague Come en la mesa con amigos o familiares por lo menos una vez al dia Apague la televisin y aparatos electrnicos durante la comida  Su objetivo debe ser perder Neomia Dearuna libra por semana  Tome Metformin 500 mg 2 pastillas en la manana y 1 pastilla en la tarde o noche con comida Despues mas or menos 3 dias a 7 dias, tome 2 pastillas dos veces al dia con comida  Nueva Metformin receta en una mes:  1000 mg pastillas, pero solamente 1 pastilla dose veces al dia

## 2016-02-17 NOTE — Progress Notes (Signed)
I still don't see the hepatitis labs--please get results from [facility]

## 2016-03-07 LAB — HGB A1C W/O EAG: HEMOGLOBIN A1C: 8.7 % — AB (ref 4.8–5.6)

## 2016-03-09 ENCOUNTER — Ambulatory Visit: Payer: No Typology Code available for payment source | Admitting: Internal Medicine

## 2016-03-12 LAB — HEPATIC FUNCTION PANEL
ALK PHOS: 71 IU/L (ref 39–117)
ALT: 147 IU/L — AB (ref 0–32)
AST: 72 IU/L — ABNORMAL HIGH (ref 0–40)
Albumin: 4.5 g/dL (ref 3.5–5.5)
BILIRUBIN TOTAL: 0.4 mg/dL (ref 0.0–1.2)
BILIRUBIN, DIRECT: 0.13 mg/dL (ref 0.00–0.40)
TOTAL PROTEIN: 7.3 g/dL (ref 6.0–8.5)

## 2016-03-23 NOTE — Progress Notes (Signed)
My mistake FU appointment is 04/13/16 @ 10:30AM

## 2016-03-23 NOTE — Progress Notes (Signed)
Patient has FU appointment on 04/23/16 @ 10:30 AM

## 2016-04-13 ENCOUNTER — Ambulatory Visit: Payer: No Typology Code available for payment source | Admitting: Internal Medicine

## 2016-06-01 ENCOUNTER — Ambulatory Visit (INDEPENDENT_AMBULATORY_CARE_PROVIDER_SITE_OTHER): Payer: Self-pay | Admitting: Internal Medicine

## 2016-06-01 VITALS — BP 120/70 | HR 70 | Resp 17 | Ht 62.0 in | Wt 208.5 lb

## 2016-06-01 DIAGNOSIS — K760 Fatty (change of) liver, not elsewhere classified: Secondary | ICD-10-CM | POA: Insufficient documentation

## 2016-06-01 DIAGNOSIS — Z23 Encounter for immunization: Secondary | ICD-10-CM

## 2016-06-01 DIAGNOSIS — E7800 Pure hypercholesterolemia, unspecified: Secondary | ICD-10-CM

## 2016-06-01 DIAGNOSIS — E1165 Type 2 diabetes mellitus with hyperglycemia: Secondary | ICD-10-CM

## 2016-06-01 MED ORDER — AGAMATRIX PRESTO PRO METER DEVI: 0 refills | Status: DC

## 2016-06-01 MED ORDER — METFORMIN HCL 1000 MG PO TABS: ORAL_TABLET | ORAL | 11 refills | Status: DC

## 2016-06-01 MED ORDER — GLUCOSE BLOOD VI STRP: ORAL_STRIP | 12 refills | Status: DC

## 2016-06-01 NOTE — Progress Notes (Signed)
   Subjective:    Patient ID: Apolonio SchneidersEstela Parada-Carreno, female    DOB: 01/13/1986, 30 y.o.   MRN: [id number]  HPI   1.  Mild hyperlipidemia/fatty liver:  Has made changes to diet, but has not been increasing physical activity.  Does not want to take a statin and am concerned at her age to put her on a statin, though she is not planning to have more children and has Nexplanon. Pt. Is nonfasting today.  2.  DM Type 2:  Checking sugars regularly.  Sugars in the morning fasting are around 90.   Sugars at bedtime, about 2-3 pp are 135-145. Last A1C was 8.7%  In June. Has not had flu vaccine  Current Meds  Medication Sig  . etonogestrel (NEXPLANON) 68 MG IMPL implant 1 each by Subdermal route once.  . metFORMIN (GLUCOPHAGE) 500 MG tablet 1 tab by mouth with meals twice daily   No Known Allergies Review of Systems     Objective:   Physical Exam  NAD Lungs:  CTA CV:  RRR without murmur or rub, radial and DP pulses normal and equal Abd:  S, NT, No HSM or mass, + BS LE:  No edema.        Assessment & Plan:  1.  Hyperlipidemia with hepatic steatosis:  Work on diet and increase physical activity with repeat CMP and FLP in 2 months.    2.  DM Type 2:  Sounds like much improved.  Change to 1000 mg twice daily of Metformin.  A1C with fasting labs in 2 months. Orders for glucometer and test strips to keep closer monitoring of sugars. Asked her to check sugars mainly before dinner in the evening.  3.  HM:  Flu vaccine today.

## 2016-06-02 ENCOUNTER — Encounter: Payer: Self-pay | Admitting: Internal Medicine

## 2016-06-15 ENCOUNTER — Other Ambulatory Visit: Payer: Self-pay

## 2016-06-22 ENCOUNTER — Other Ambulatory Visit (INDEPENDENT_AMBULATORY_CARE_PROVIDER_SITE_OTHER): Payer: Self-pay | Admitting: Internal Medicine

## 2016-06-22 DIAGNOSIS — E1165 Type 2 diabetes mellitus with hyperglycemia: Secondary | ICD-10-CM

## 2016-06-22 DIAGNOSIS — E7800 Pure hypercholesterolemia, unspecified: Secondary | ICD-10-CM

## 2016-06-22 DIAGNOSIS — K760 Fatty (change of) liver, not elsewhere classified: Secondary | ICD-10-CM

## 2016-06-22 NOTE — Progress Notes (Signed)
Here for labs:  A1C, FLP, CMP

## 2016-06-23 LAB — SPECIMEN STATUS

## 2016-06-25 LAB — COMPREHENSIVE METABOLIC PANEL
A/G RATIO: 1.5 (ref 1.2–2.2)
ALT: 103 IU/L — AB (ref 0–32)
AST: 70 IU/L — AB (ref 0–40)
Albumin: 4.1 g/dL (ref 3.5–5.5)
Alkaline Phosphatase: 63 IU/L (ref 39–117)
BILIRUBIN TOTAL: 0.6 mg/dL (ref 0.0–1.2)
BUN/Creatinine Ratio: 16 (ref 9–23)
BUN: 9 mg/dL (ref 6–20)
CHLORIDE: 100 mmol/L (ref 96–106)
CO2: 25 mmol/L (ref 18–29)
Calcium: 9 mg/dL (ref 8.7–10.2)
Creatinine, Ser: 0.57 mg/dL (ref 0.57–1.00)
GFR calc non Af Amer: 126 mL/min/{1.73_m2} (ref >59)
GFR, EST AFRICAN AMERICAN: 145 mL/min/{1.73_m2} (ref >59)
GLOBULIN, TOTAL: 2.8 g/dL (ref 1.5–4.5)
Glucose: 143 mg/dL — ABNORMAL HIGH (ref 65–99)
POTASSIUM: 4.2 mmol/L (ref 3.5–5.2)
SODIUM: 139 mmol/L (ref 134–144)
Total Protein: 6.9 g/dL (ref 6.0–8.5)

## 2016-06-25 LAB — LIPID PANEL W/O CHOL/HDL RATIO
Cholesterol, Total: 191 mg/dL (ref 100–199)
HDL: 46 mg/dL (ref >39)
LDL Calculated: 106 mg/dL — ABNORMAL HIGH (ref 0–99)
TRIGLYCERIDES: 197 mg/dL — AB (ref 0–149)
VLDL Cholesterol Cal: 39 mg/dL (ref 5–40)

## 2016-06-27 LAB — HGB A1C W/O EAG: Hgb A1c MFr Bld: 7.6 % — ABNORMAL HIGH (ref 4.8–5.6)

## 2016-06-27 LAB — SPECIMEN STATUS REPORT

## 2016-09-28 ENCOUNTER — Ambulatory Visit (INDEPENDENT_AMBULATORY_CARE_PROVIDER_SITE_OTHER): Payer: Self-pay | Admitting: Internal Medicine

## 2016-09-28 ENCOUNTER — Encounter: Payer: Self-pay | Admitting: Internal Medicine

## 2016-09-28 VITALS — BP 126/80 | HR 72 | Resp 12 | Ht 63.0 in | Wt 206.0 lb

## 2016-09-28 DIAGNOSIS — E7800 Pure hypercholesterolemia, unspecified: Secondary | ICD-10-CM

## 2016-09-28 DIAGNOSIS — IMO0001 Reserved for inherently not codable concepts without codable children: Secondary | ICD-10-CM

## 2016-09-28 DIAGNOSIS — E1165 Type 2 diabetes mellitus with hyperglycemia: Secondary | ICD-10-CM

## 2016-09-28 DIAGNOSIS — Z6836 Body mass index (BMI) 36.0-36.9, adult: Secondary | ICD-10-CM

## 2016-09-28 DIAGNOSIS — K760 Fatty (change of) liver, not elsewhere classified: Secondary | ICD-10-CM

## 2016-09-28 DIAGNOSIS — E6609 Other obesity due to excess calories: Secondary | ICD-10-CM

## 2016-09-28 MED ORDER — METFORMIN HCL 1000 MG PO TABS: ORAL_TABLET | ORAL | 11 refills | Status: DC

## 2016-09-28 NOTE — Progress Notes (Signed)
   Subjective:    Patient ID: [name] SchneidersEstela [name], female    DOB: 01/16/1986, 31 y.o.   MRN: [id number]  HPI   1.  DM:  Brings in sugars.  Her morning sugars are almost all below 100.  Afternoon sugars in mid to upper 100s.  Generally the highest is 185. Her orange card is not current, so she has been unable to get her test strips. Last A1C in October dropped to 7.6% from 8.7 % in June.   Not currently being physically active due to cold weather.    2. Obesity/ Fatty liver with dyslipidemia:  Has lost 2 lbs.  Again, not really physically active.  Current Meds  Medication Sig  . Blood Glucose Monitoring Suppl (AGAMATRIX PRESTO PRO METER) DEVI Twice daily glucose checks  . etonogestrel (NEXPLANON) 68 MG IMPL implant 1 each by Subdermal route once.  Marland Kitchen. glucose blood (AGAMATRIX PRESTO TEST) test strip Twice daily blood glucose checks  . metFORMIN (GLUCOPHAGE) 1000 MG tablet 1 tab by mouth with meals twice daily (Patient taking differently: 500 mg. 2 twice a day)    No Known Allergies    Review of Systems     Objective:   Physical Exam NAD Lungs:  CTA CV:  RRR without murmur or rub, radial and DP pulses normal and equal Abd: S, NT, No HSM or masses, + BS LE:  No edema       Assessment & Plan:  1.  DM:  Not clear if her sugars are currently running as well as documented with the history of her inability to get test strips.  Check A1C with fasting labs in 1 week..  Encouraged to get restarted on some form of daily physical activity and have a plan B for inclement weather.  2.  Hepatic Steatosis:  Working on this with lifestyle changes until definitively finished growing family. As in #1.  3.  Hypercholesterolemia:  Lifestyle changes as above.  4.  Obesity:  Has lost 2 lbs, but needs improvement with lifestyle changes.  Follow up in 1 week for fasting labs:  A1C and FLP in 1 week. Followup with me in 3 months.

## 2016-10-05 ENCOUNTER — Other Ambulatory Visit (INDEPENDENT_AMBULATORY_CARE_PROVIDER_SITE_OTHER): Payer: Self-pay

## 2016-10-05 DIAGNOSIS — E119 Type 2 diabetes mellitus without complications: Secondary | ICD-10-CM

## 2016-10-05 DIAGNOSIS — E785 Hyperlipidemia, unspecified: Secondary | ICD-10-CM

## 2016-10-06 LAB — HGB A1C W/O EAG: HEMOGLOBIN A1C: 10.5 % — AB (ref 4.8–5.6)

## 2016-10-06 LAB — LIPID PANEL W/O CHOL/HDL RATIO
CHOLESTEROL TOTAL: 191 mg/dL (ref 100–199)
HDL: 49 mg/dL (ref >39)
LDL Calculated: 84 mg/dL (ref 0–99)
Triglycerides: 291 mg/dL — ABNORMAL HIGH (ref 0–149)
VLDL Cholesterol Cal: 58 mg/dL — ABNORMAL HIGH (ref 5–40)

## 2016-12-28 ENCOUNTER — Ambulatory Visit: Payer: Self-pay | Admitting: Internal Medicine

## 2018-01-16 ENCOUNTER — Ambulatory Visit: Payer: Self-pay | Admitting: Internal Medicine

## 2018-01-16 ENCOUNTER — Encounter: Payer: Self-pay | Admitting: Internal Medicine

## 2018-01-16 VITALS — BP 124/76 | HR 60 | Resp 12 | Ht 63.0 in | Wt 195.0 lb

## 2018-01-16 DIAGNOSIS — R1011 Right upper quadrant pain: Secondary | ICD-10-CM

## 2018-01-16 DIAGNOSIS — E1165 Type 2 diabetes mellitus with hyperglycemia: Secondary | ICD-10-CM

## 2018-01-16 LAB — GLUCOSE, POCT (MANUAL RESULT ENTRY): POC Glucose: 113 mg/dl — AB (ref 70–99)

## 2018-01-16 MED ORDER — GLUCOSE BLOOD VI STRP: ORAL_STRIP | 12 refills | Status: AC

## 2018-01-16 MED ORDER — AGAMATRIX ULTRA-THIN LANCETS MISC: 11 refills | Status: AC

## 2018-01-16 MED ORDER — AGAMATRIX PRESTO PRO METER DEVI: 0 refills | Status: AC

## 2018-01-16 MED ORDER — METFORMIN HCL 1000 MG PO TABS: ORAL_TABLET | ORAL | 11 refills | Status: DC
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
## 2018-01-17 ENCOUNTER — Other Ambulatory Visit: Payer: Self-pay

## 2018-01-17 DIAGNOSIS — E1165 Type 2 diabetes mellitus with hyperglycemia: Secondary | ICD-10-CM

## 2018-01-17 DIAGNOSIS — E7800 Pure hypercholesterolemia, unspecified: Secondary | ICD-10-CM

## 2018-01-17 DIAGNOSIS — Z79899 Other long term (current) drug therapy: Secondary | ICD-10-CM

## 2018-01-18 LAB — CBC WITH DIFFERENTIAL/PLATELET
Basophils Absolute: 0 10*3/uL (ref 0.0–0.2)
Basos: 0 %
EOS (ABSOLUTE): 0.2 10*3/uL (ref 0.0–0.4)
Eos: 2 %
Hematocrit: 42.1 % (ref 34.0–46.6)
Hemoglobin: 13.5 g/dL (ref 11.1–15.9)
IMMATURE GRANULOCYTES: 0 %
Immature Grans (Abs): 0 10*3/uL (ref 0.0–0.1)
LYMPHS: 25 %
Lymphocytes Absolute: 1.9 10*3/uL (ref 0.7–3.1)
MCH: 28.4 pg (ref 26.6–33.0)
MCHC: 32.1 g/dL (ref 31.5–35.7)
MCV: 88 fL (ref 79–97)
MONOS ABS: 0.6 10*3/uL (ref 0.1–0.9)
Monocytes: 8 %
NEUTROS PCT: 65 %
Neutrophils Absolute: 4.8 10*3/uL (ref 1.4–7.0)
PLATELETS: 329 10*3/uL (ref 150–450)
RBC: 4.76 x10E6/uL (ref 3.77–5.28)
RDW: 13.2 % (ref 12.3–15.4)
WBC: 7.6 10*3/uL (ref 3.4–10.8)

## 2018-01-18 LAB — COMPREHENSIVE METABOLIC PANEL
ALK PHOS: 78 IU/L (ref 39–117)
ALT: 26 IU/L (ref 0–32)
AST: 25 IU/L (ref 0–40)
Albumin/Globulin Ratio: 1.4 (ref 1.2–2.2)
Albumin: 4 g/dL (ref 3.5–5.5)
BUN/Creatinine Ratio: 11 (ref 9–23)
BUN: 7 mg/dL (ref 6–20)
Bilirubin Total: 0.8 mg/dL (ref 0.0–1.2)
CO2: 23 mmol/L (ref 20–29)
CREATININE: 0.63 mg/dL (ref 0.57–1.00)
Calcium: 9.1 mg/dL (ref 8.7–10.2)
Chloride: 99 mmol/L (ref 96–106)
GFR calc Af Amer: 138 mL/min/{1.73_m2} (ref >59)
GFR calc non Af Amer: 120 mL/min/{1.73_m2} (ref >59)
GLOBULIN, TOTAL: 2.9 g/dL (ref 1.5–4.5)
Glucose: 141 mg/dL — ABNORMAL HIGH (ref 65–99)
POTASSIUM: 4 mmol/L (ref 3.5–5.2)
SODIUM: 139 mmol/L (ref 134–144)
Total Protein: 6.9 g/dL (ref 6.0–8.5)

## 2018-01-18 LAB — LIPID PANEL W/O CHOL/HDL RATIO
CHOLESTEROL TOTAL: 193 mg/dL (ref 100–199)
HDL: 48 mg/dL (ref >39)
LDL CALC: 102 mg/dL — AB (ref 0–99)
Triglycerides: 216 mg/dL — ABNORMAL HIGH (ref 0–149)
VLDL CHOLESTEROL CAL: 43 mg/dL — AB (ref 5–40)

## 2018-01-18 LAB — MICROALBUMIN / CREATININE URINE RATIO
Creatinine, Urine: 212.4 mg/dL
MICROALBUM., U, RANDOM: 27 ug/mL
Microalb/Creat Ratio: 12.7 mg/g creat (ref 0.0–30.0)

## 2018-01-18 LAB — HGB A1C W/O EAG: Hgb A1c MFr Bld: 8.8 % — ABNORMAL HIGH (ref 4.8–5.6)

## 2018-04-17 ENCOUNTER — Ambulatory Visit: Payer: Self-pay | Admitting: Internal Medicine

## 2019-01-05 ENCOUNTER — Other Ambulatory Visit: Payer: Self-pay

## 2019-01-05 ENCOUNTER — Telehealth: Payer: Self-pay | Admitting: Internal Medicine

## 2019-01-05 MED ORDER — METFORMIN HCL 1000 MG PO TABS: ORAL_TABLET | ORAL | 2 refills | Status: DC

## 2019-01-05 NOTE — Telephone Encounter (Signed)
Patient called requesting Rx on metFORMIN (GLUCOPHAGE) 1000 MG tablet. Please advise.

## 2019-01-05 NOTE — Telephone Encounter (Signed)
rx sent to [facility]

## 2019-02-26 ENCOUNTER — Other Ambulatory Visit: Payer: Self-pay

## 2019-02-26 ENCOUNTER — Encounter: Payer: Self-pay | Admitting: Internal Medicine

## 2019-02-26 ENCOUNTER — Ambulatory Visit: Payer: Self-pay | Admitting: Internal Medicine

## 2019-02-26 VITALS — BP 130/82 | HR 68 | Resp 12 | Ht 63.0 in | Wt 194.0 lb

## 2019-02-26 DIAGNOSIS — E1165 Type 2 diabetes mellitus with hyperglycemia: Secondary | ICD-10-CM

## 2019-02-26 DIAGNOSIS — Z6836 Body mass index (BMI) 36.0-36.9, adult: Secondary | ICD-10-CM

## 2019-02-26 DIAGNOSIS — K760 Fatty (change of) liver, not elsewhere classified: Secondary | ICD-10-CM

## 2019-02-26 DIAGNOSIS — Z79899 Other long term (current) drug therapy: Secondary | ICD-10-CM

## 2019-02-26 DIAGNOSIS — E7800 Pure hypercholesterolemia, unspecified: Secondary | ICD-10-CM

## 2019-02-26 MED ORDER — METFORMIN HCL 1000 MG PO TABS: ORAL_TABLET | ORAL | 3 refills | Status: DC

## 2019-02-26 NOTE — Progress Notes (Signed)
    Subjective:    Patient ID: [name] [name], female   DOB: 23-Oct-1985, 33 y.o.   MRN: [id number]   HPI  Has not been seen in over 1 year.  She was going to [employer] since last visit.  Somehow the instructions on her bottle for Metformin are from a 500 mg tablet prescription.    1.  DM: She is taking Metformin 1000 mg twice daily, not as per instructions on the bottle, which would be too much.  She did not come back here as she thought since she did not have the orange card renewed, she could not be seen.  She is checking her sugars.  90s to low 100s in the morning and up to 180 in the evening before dinner. She has been unable to get an orange card as her husband's boss does not want to write a letter.   She is trying to get away from sweets and fat.    Breakfast:  Green juice:  Vegetables/tomatoes Snack:  Peanuts, cucumber with lime; sometimes yogurt with sugar free jello. Lunch:  Beans, avocado, green beans, cactus.  Water. Dinner:  Chicken or steak, beans, sometimes soup.  No vegetable in the evening currently, but would be willing to add. Has not had eye check as no orange card. Checks feet nightly.  No problems. Walks--just started back   2.  Hyperlipidemia:  As above.     Current Meds  Medication Sig  . AGAMATRIX ULTRA-THIN LANCETS MISC Check blood sugars before meals twice daily  . Blood Glucose Monitoring Suppl (AGAMATRIX PRESTO PRO METER) DEVI Check sugars twice daily before meals  . glucose blood (AGAMATRIX PRESTO TEST) test strip Twice daily blood glucose checks  . metFORMIN (GLUCOPHAGE) 1000 MG tablet 2 tabs by mouth with morning and evening meal.  1 tab by mouth with midday meal  . PARAGARD INTRAUTERINE COPPER IUD IUD 1 each by Intrauterine route once.    No Known Allergies   Review of Systems    Objective:   BP 130/82 (BP Location: Left Arm, Patient Position: Sitting, Cuff Size: Normal)   Pulse 68   Resp 12   Ht 5\' 3"  (1.6 m)   Wt 194 lb (88 kg)    LMP 02/25/2019   BMI 34.37 kg/m   Physical Exam   NAD HEENT:  PERRL, EOMI Neck:  Supple, No adenopathy, no thyromegaly Chest:  CTA CV:  RRR with normal S1 and S2, No S3, S4 or murmur.  No carotid bruits.  Carotid, radial and DP /PT pulses normal and equal Abd:  S, NT, No HSM or mass.  + BS  Diabetic Foot Exam - Simple   Simple Foot Form Diabetic Foot exam was performed with the following findings: Yes 02/26/2019 11:05 AM  Visual Inspection No deformities, no ulcerations, no other skin breakdown bilaterally: Yes Sensation Testing Intact to touch and monofilament testing bilaterally: Yes Pulse Check Posterior Tibialis and Dorsalis pulse intact bilaterally: Yes Comments       Assessment & Plan   1.  DM:  Metformin dosing clarified and corrected with new Rx to [facility] 1000 mg twice daily. Urine microalbumin/crea and A1C CBC  2.  Hypercholesterolemia:  FLP  3.  Hepatic steatosis:  CMP  4.  Obesity:  Encouraged working further on physical activity and diet--add more veggies in evening.  5.  Discussed other options to get her signed up with orange card for better access to eye specialist and glucometer supplies.

## 2019-02-27 LAB — CBC WITH DIFFERENTIAL/PLATELET
Basophils Absolute: 0 10*3/uL (ref 0.0–0.2)
Basos: 1 %
EOS (ABSOLUTE): 0.1 10*3/uL (ref 0.0–0.4)
Eos: 1 %
Hematocrit: 44.3 % (ref 34.0–46.6)
Hemoglobin: 14.1 g/dL (ref 11.1–15.9)
Immature Grans (Abs): 0 10*3/uL (ref 0.0–0.1)
Immature Granulocytes: 0 %
Lymphocytes Absolute: 2.1 10*3/uL (ref 0.7–3.1)
Lymphs: 28 %
MCH: 28.2 pg (ref 26.6–33.0)
MCHC: 31.8 g/dL (ref 31.5–35.7)
MCV: 89 fL (ref 79–97)
Monocytes Absolute: 0.4 10*3/uL (ref 0.1–0.9)
Monocytes: 6 %
Neutrophils Absolute: 4.9 10*3/uL (ref 1.4–7.0)
Neutrophils: 64 %
Platelets: 320 10*3/uL (ref 150–450)
RBC: 5 x10E6/uL (ref 3.77–5.28)
RDW: 13.1 % (ref 11.7–15.4)
WBC: 7.6 10*3/uL (ref 3.4–10.8)

## 2019-02-27 LAB — COMPREHENSIVE METABOLIC PANEL
ALT: 20 IU/L (ref 0–32)
AST: 19 IU/L (ref 0–40)
Albumin/Globulin Ratio: 1.7 (ref 1.2–2.2)
Albumin: 4.3 g/dL (ref 3.8–4.8)
Alkaline Phosphatase: 68 IU/L (ref 39–117)
BUN/Creatinine Ratio: 12 (ref 9–23)
BUN: 7 mg/dL (ref 6–20)
Bilirubin Total: 0.4 mg/dL (ref 0.0–1.2)
CO2: 23 mmol/L (ref 20–29)
Calcium: 9.3 mg/dL (ref 8.7–10.2)
Chloride: 104 mmol/L (ref 96–106)
Creatinine, Ser: 0.6 mg/dL (ref 0.57–1.00)
GFR calc Af Amer: 139 mL/min/{1.73_m2} (ref >59)
GFR calc non Af Amer: 121 mL/min/{1.73_m2} (ref >59)
Globulin, Total: 2.6 g/dL (ref 1.5–4.5)
Glucose: 148 mg/dL — ABNORMAL HIGH (ref 65–99)
Potassium: 4.3 mmol/L (ref 3.5–5.2)
Sodium: 142 mmol/L (ref 134–144)
Total Protein: 6.9 g/dL (ref 6.0–8.5)

## 2019-02-27 LAB — MICROALBUMIN / CREATININE URINE RATIO
Creatinine, Urine: 18.8 mg/dL
Microalb/Creat Ratio: 100 mg/g creat — ABNORMAL HIGH (ref 0–29)
Microalbumin, Urine: 18.8 ug/mL

## 2019-02-27 LAB — LIPID PANEL W/O CHOL/HDL RATIO
Cholesterol, Total: 190 mg/dL (ref 100–199)
HDL: 55 mg/dL (ref >39)
LDL Calculated: 109 mg/dL — ABNORMAL HIGH (ref 0–99)
Triglycerides: 129 mg/dL (ref 0–149)
VLDL Cholesterol Cal: 26 mg/dL (ref 5–40)

## 2019-02-27 LAB — HGB A1C W/O EAG: Hgb A1c MFr Bld: 8.9 % — ABNORMAL HIGH (ref 4.8–5.6)

## 2019-06-26 ENCOUNTER — Telehealth: Payer: Self-pay | Admitting: Internal Medicine

## 2019-06-26 ENCOUNTER — Encounter: Payer: Self-pay | Admitting: Internal Medicine

## 2019-06-26 NOTE — Telephone Encounter (Signed)
Pt. Called to reschedule her office appointments due to being exposed to someone confirmed of COVID-19.  Patient was given self quarantine instructions by Cherice with Estefania interpreting. Pt. Also advised to go get tested and was given Testing location.  Self Quarantine instructions and Prevent the spread of covid-19 if you are sick flyers mailed to pt.'s home on 06/26/2019.

## 2019-06-29 ENCOUNTER — Other Ambulatory Visit: Payer: Self-pay

## 2019-07-02 ENCOUNTER — Encounter: Payer: Self-pay | Admitting: Internal Medicine

## 2019-07-07 ENCOUNTER — Other Ambulatory Visit: Payer: Self-pay | Admitting: Cardiology

## 2019-07-07 DIAGNOSIS — Z20822 Contact with and (suspected) exposure to covid-19: Secondary | ICD-10-CM

## 2019-07-09 LAB — NOVEL CORONAVIRUS, NAA: SARS-CoV-2, NAA: NOT DETECTED

## 2019-07-17 ENCOUNTER — Other Ambulatory Visit: Payer: Self-pay

## 2019-07-17 DIAGNOSIS — E1165 Type 2 diabetes mellitus with hyperglycemia: Secondary | ICD-10-CM

## 2019-07-17 DIAGNOSIS — E7800 Pure hypercholesterolemia, unspecified: Secondary | ICD-10-CM

## 2019-07-18 LAB — LIPID PANEL W/O CHOL/HDL RATIO
Cholesterol, Total: 199 mg/dL (ref 100–199)
HDL: 47 mg/dL (ref >39)
LDL Chol Calc (NIH): 125 mg/dL — ABNORMAL HIGH (ref 0–99)
Triglycerides: 153 mg/dL — ABNORMAL HIGH (ref 0–149)
VLDL Cholesterol Cal: 27 mg/dL (ref 5–40)

## 2019-07-18 LAB — HGB A1C W/O EAG: Hgb A1c MFr Bld: 9.7 % — ABNORMAL HIGH (ref 4.8–5.6)

## 2019-08-25 ENCOUNTER — Telehealth: Payer: Self-pay | Admitting: Internal Medicine

## 2019-08-25 NOTE — Telephone Encounter (Signed)
noted 

## 2019-08-25 NOTE — Telephone Encounter (Signed)
Called pt. To discuss lab results and confirm cpe appt. Scheduled for 08/27/2019. After screened pt. With [phone] questions pt. Experiencing headache and sore throat x 4 days. Patient denied any other symptoms and is not sure if was exposed with it. Pt. Advise to go and get tested and to self isolate until results comes back. Provided information on where to get tested.

## 2019-08-27 ENCOUNTER — Other Ambulatory Visit: Payer: Self-pay

## 2019-08-27 ENCOUNTER — Encounter: Payer: Self-pay | Admitting: Internal Medicine

## 2019-08-27 DIAGNOSIS — Z20822 Contact with and (suspected) exposure to covid-19: Secondary | ICD-10-CM

## 2019-08-29 LAB — NOVEL CORONAVIRUS, NAA: SARS-CoV-2, NAA: NOT DETECTED

## 2019-10-01 ENCOUNTER — Ambulatory Visit: Payer: Self-pay | Admitting: Internal Medicine

## 2019-10-01 ENCOUNTER — Other Ambulatory Visit: Payer: Self-pay

## 2019-10-01 ENCOUNTER — Encounter: Payer: Self-pay | Admitting: Internal Medicine

## 2019-10-01 VITALS — BP 122/82 | HR 82 | Resp 12 | Ht 63.0 in | Wt 198.0 lb

## 2019-10-01 DIAGNOSIS — E7800 Pure hypercholesterolemia, unspecified: Secondary | ICD-10-CM

## 2019-10-01 DIAGNOSIS — Z0000 Encounter for general adult medical examination without abnormal findings: Secondary | ICD-10-CM

## 2019-10-01 DIAGNOSIS — B3731 Acute candidiasis of vulva and vagina: Secondary | ICD-10-CM

## 2019-10-01 DIAGNOSIS — B373 Candidiasis of vulva and vagina: Secondary | ICD-10-CM

## 2019-10-01 DIAGNOSIS — Z6836 Body mass index (BMI) 36.0-36.9, adult: Secondary | ICD-10-CM

## 2019-10-01 DIAGNOSIS — E1165 Type 2 diabetes mellitus with hyperglycemia: Secondary | ICD-10-CM

## 2019-10-01 LAB — POCT WET PREP WITH KOH
Clue Cells Wet Prep HPF POC: NEGATIVE
KOH Prep POC: NEGATIVE
RBC Wet Prep HPF POC: NEGATIVE
Trichomonas, UA: NEGATIVE
Yeast Wet Prep HPF POC: POSITIVE

## 2019-10-01 LAB — GLUCOSE, POCT (MANUAL RESULT ENTRY): POC Glucose: 313 mg/dl — AB (ref 70–99)

## 2019-10-01 MED ORDER — SIMVASTATIN 20 MG PO TABS: ORAL_TABLET | ORAL | 11 refills | Status: AC

## 2019-10-01 MED ORDER — GLIPIZIDE 5 MG PO TABS: 5.0000 mg | ORAL_TABLET | Freq: Two times a day (BID) | ORAL | 11 refills | Status: AC

## 2019-10-01 MED ORDER — FLUCONAZOLE 150 MG PO TABS: ORAL_TABLET | ORAL | 0 refills | Status: AC

## 2019-10-01 NOTE — Patient Instructions (Addendum)

## 2019-10-01 NOTE — Progress Notes (Signed)
Subjective:    Patient ID: [name] [name], female   DOB: 04-14-1986, 34 y.o.   MRN: [id number]   HPI   Robbie Louis interprets  CPE without pap  1.  Pap:  Last pap 08/2017 prior to IUD placement.  Normal.    2.  Mammogram:  Never.  No family history.  3.  Osteoprevention:  4 servings of powdered milk and queso fresca daily.  Zumba twice weekly.  Bakes bread and sells herself.   4.  Guaiac Cards:  Never.    5.  Colonoscopy:  Never.  No family history of colon cancer.  6.  Immunizations:  Did not get influenza vaccine this year as lost a family member to COVID19.  Cancelled her appt. Immunization History  Administered Date(s) Administered  . Influenza Split 06/30/2012  . Influenza, Quadrivalent, Recombinant, Inj, Pf 06/01/2016  . Influenza,inj,Quad PF,6+ Mos 10/06/2015  . Pneumococcal Polysaccharide-23 02/02/2016  . Tdap 06/30/2012     7.  Glucose/Cholesterol:  DM and hyperlipidemia.  Both not at goal in November and had to cancel appt following. A1C was 9.7% Lipid Panel     Component Value Date/Time   CHOL 199 07/17/2019 0853   TRIG 153 (H) 07/17/2019 0853   HDL 47 07/17/2019 0853   CHOLHDL 3.9 Ratio 08/17/2010 2100   VLDL 32 08/17/2010 2100   LDLCALC 125 (H) 07/17/2019 0853   LABVLDL 27 07/17/2019 0853   States healthy habits have not been great in recent months.  She did not realize that her intake of carbs, which is significant, increases her sugars.  Eating very large tortillas (one) twice daily. Apparently, also missing her  Metformin frequently.  Current Meds  Medication Sig  . AGAMATRIX ULTRA-THIN LANCETS MISC Check blood sugars before meals twice daily  . Blood Glucose Monitoring Suppl (AGAMATRIX PRESTO PRO METER) DEVI Check sugars twice daily before meals  . glucose blood (AGAMATRIX PRESTO TEST) test strip Twice daily blood glucose checks  . metFORMIN (GLUCOPHAGE) 1000 MG tablet 1 tab by mouth twice daily with meals  . PARAGARD INTRAUTERINE COPPER IUD  IUD 1 each by Intrauterine route once.    No Known Allergies   Past Medical History:  Diagnosis Date  . Complication of anesthesia    hyperventilated "went to sleep"  . Diabetes mellitus with hyperglycemia, without long-term current use of insulin (HCC) 02/2015  . Hyperlipidemia     Past Surgical History:  Procedure Laterality Date  . CESAREAN SECTION  2007   Family History  Problem Relation Age of Onset  . Diabetes Father        more so hyperglycemia  . Diabetes Brother        High blood sugar without diagnosis of DM    Social History   Socioeconomic History  . Marital status: Married    Spouse name: Jannifer Rodney  . Number of children: 4  . Years of education: 65  . Highest education level: Not on file  Occupational History  . Occupation: [employer]  Tobacco Use  . Smoking status: Never Smoker  . Smokeless tobacco: Never Used  Substance and Sexual Activity  . Alcohol use: No  . Drug use: No  . Sexual activity: Yes    Birth control/protection: Implant    Comment: Nexplanon--should last until September  Other Topics Concern  . Not on file  Social History Narrative      C[place] to [place] In [place]   Lives at home with husband and 4 children   Social  Determinants of Health   Financial Resource Strain: Low Risk   . Difficulty of Paying Living Expenses: Not hard at all  Food Insecurity: No Food Insecurity  . Worried About Programme researcher, broadcasting/film/video in the Last Year: Never true  . Ran Out of Food in the Last Year: Never true  Transportation Needs: No Transportation Needs  . Lack of Transportation (Medical): No  . Lack of Transportation (Non-Medical): No  Physical Activity:   . Days of Exercise per Week: Not on file  . Minutes of Exercise per Session: Not on file  Stress:   . Feeling of Stress : Not on file  Social Connections:   . Frequency of Communication with Friends and Family: Not on file  . Frequency of Social Gatherings with Friends and Family: Not on file  .  Attends Religious Services: Not on file  . Active Member of Clubs or Organizations: Not on file  . Attends Banker Meetings: Not on file  . Marital Status: Not on file  Intimate Partner Violence: Not At Risk  . Fear of Current or Ex-Partner: No  . Emotionally Abused: No  . Physically Abused: No  . Sexually Abused: No      Review of Systems  Eyes: Negative for visual disturbance (no eye exam in past year.).      Objective:   BP 122/82 (BP Location: Left Arm, Patient Position: Sitting, Cuff Size: Large)   Pulse 82   Resp 12   Ht 5\' 3"  (1.6 m)   Wt 198 lb (89.8 kg)   LMP 09/10/2019   BMI 35.07 kg/m   Physical Exam  Constitutional: She is oriented to person, place, and time. She appears well-developed.  Obese  HENT:  Head: Normocephalic and atraumatic.  Right Ear: Tympanic membrane, external ear and ear canal normal.  Left Ear: Tympanic membrane, external ear and ear canal normal.  Nose: Nose normal.  Mouth/Throat: Uvula is midline, oropharynx is clear and moist and mucous membranes are normal.  Eyes: Pupils are equal, round, and reactive to light. Conjunctivae and EOM are normal.  Neck: No thyromegaly present.  Cardiovascular: Normal rate and regular rhythm. Exam reveals no S3, no S4 and no friction rub.  No murmur heard. Carotid, radial, femoral, DP, PT pulses normal and equal.  Pulmonary/Chest: Effort normal and breath sounds normal. Right breast exhibits no inverted nipple, no mass, no nipple discharge, no skin change and no tenderness. Left breast exhibits no inverted nipple, no mass, no nipple discharge and no skin change.  Abdominal: Soft. Bowel sounds are normal. She exhibits no mass. There is no hepatosplenomegaly. There is no abdominal tenderness. No hernia.  Genitourinary:    Genitourinary Comments: Normal external female genitalia. White vaginal discharge with mild underlying vaginal inflammation. No uterine or adnexal mass or tenderness.  No CMT     Musculoskeletal:        General: Normal range of motion.     Cervical back: Full passive range of motion without pain, normal range of motion and neck supple.  Lymphadenopathy:       Head (right side): No submental and no submandibular adenopathy present.       Head (left side): No submental and no submandibular adenopathy present.    She has no cervical adenopathy.    She has no axillary adenopathy.       Right: No inguinal and no supraclavicular adenopathy present.       Left: No inguinal and no supraclavicular adenopathy present.  Neurological: She is alert and oriented to person, place, and time. She has normal strength and normal reflexes. No cranial nerve deficit or sensory deficit. Coordination and gait normal.  Skin: Skin is warm. No rash noted.  Psychiatric: She has a normal mood and affect. Her speech is normal and behavior is normal. Judgment and thought content normal. Cognition and memory are normal.     Assessment & Plan   1.  CPE without pap Encouraged COVID vaccination when that comes available for her. Encouraged flu shot in fall  2.  DM:  Not well controlled.  Add Glipizide 5 mg twice daily with Metformin and meal.  Encouraged not to miss meds.  A1C in 3 months and OV with me. Diabetic eye referral.  3.  Hyperlipidemia:  As long as has IUD, Start  Simvastatin with FLP, hepatic profile in 3 months.  Discussed should not use if IUD removed or planning to become pregnant.  4.  Obesity:  Eating a very high carb diet.  Went over improvements she can make and adding physical activity daily.    5.  Yeast vaginitis:  Fluconazole 150 mg daily for 3 days.

## 2019-11-12 ENCOUNTER — Other Ambulatory Visit: Payer: Self-pay

## 2019-11-26 ENCOUNTER — Other Ambulatory Visit: Payer: Self-pay

## 2019-11-26 ENCOUNTER — Other Ambulatory Visit: Payer: Self-pay | Admitting: Internal Medicine

## 2019-11-26 DIAGNOSIS — E7800 Pure hypercholesterolemia, unspecified: Secondary | ICD-10-CM

## 2019-11-26 DIAGNOSIS — Z79899 Other long term (current) drug therapy: Secondary | ICD-10-CM

## 2019-11-27 LAB — LIPID PANEL WITH LDL/HDL RATIO
Cholesterol, Total: 138 mg/dL (ref 100–199)
HDL: 51 mg/dL (ref >39)
LDL Chol Calc (NIH): 70 mg/dL (ref 0–99)
LDL/HDL Ratio: 1.4 ratio (ref 0.0–3.2)
Triglycerides: 89 mg/dL (ref 0–149)
VLDL Cholesterol Cal: 17 mg/dL (ref 5–40)

## 2019-11-27 LAB — HEPATIC FUNCTION PANEL
ALT: 20 IU/L (ref 0–32)
AST: 20 IU/L (ref 0–40)
Albumin: 4.2 g/dL (ref 3.8–4.8)
Alkaline Phosphatase: 61 IU/L (ref 39–117)
Bilirubin Total: 0.4 mg/dL (ref 0.0–1.2)
Bilirubin, Direct: 0.16 mg/dL (ref 0.00–0.40)
Total Protein: 6.8 g/dL (ref 6.0–8.5)

## 2019-12-31 ENCOUNTER — Ambulatory Visit: Payer: Self-pay | Admitting: Internal Medicine

## 2020-01-14 ENCOUNTER — Ambulatory Visit: Payer: Self-pay | Admitting: Internal Medicine

## 2020-03-24 ENCOUNTER — Other Ambulatory Visit: Payer: Self-pay

## 2020-04-03 ENCOUNTER — Other Ambulatory Visit: Payer: Self-pay | Admitting: Internal Medicine
